# Patient Record
Sex: Female | Born: 1985 | Race: Black or African American | Hispanic: No | Marital: Single | Smoking: Current every day smoker
Health system: Southern US, Community
[De-identification: ages and names within clinical notes are randomized; demographics above are authoritative.]

## PROBLEM LIST (undated history)

## (undated) ENCOUNTER — Inpatient Hospital Stay (HOSPITAL_COMMUNITY): Payer: Self-pay

## (undated) DIAGNOSIS — O24419 Gestational diabetes mellitus in pregnancy, unspecified control: Secondary | ICD-10-CM

## (undated) DIAGNOSIS — Z789 Other specified health status: Secondary | ICD-10-CM

## (undated) DIAGNOSIS — O139 Gestational [pregnancy-induced] hypertension without significant proteinuria, unspecified trimester: Secondary | ICD-10-CM

## (undated) HISTORY — DX: Gestational (pregnancy-induced) hypertension without significant proteinuria, unspecified trimester: O13.9

## (undated) HISTORY — PX: COLPOSCOPY: SHX161

## (undated) HISTORY — DX: Gestational diabetes mellitus in pregnancy, unspecified control: O24.419

---
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
## 2004-02-10 ENCOUNTER — Emergency Department (HOSPITAL_COMMUNITY): Admission: EM | Admit: 2004-02-10 | Discharge: 2004-02-10 | Payer: Self-pay | Admitting: Emergency Medicine

## 2004-06-25 ENCOUNTER — Emergency Department (HOSPITAL_COMMUNITY): Admission: EM | Admit: 2004-06-25 | Discharge: 2004-06-25 | Payer: Self-pay | Admitting: Emergency Medicine

## 2005-08-20 ENCOUNTER — Emergency Department (HOSPITAL_COMMUNITY): Admission: EM | Admit: 2005-08-20 | Discharge: 2005-08-20 | Payer: Self-pay | Admitting: Emergency Medicine

## 2005-08-28 ENCOUNTER — Emergency Department (HOSPITAL_COMMUNITY): Admission: EM | Admit: 2005-08-28 | Discharge: 2005-08-28 | Payer: Self-pay | Admitting: Emergency Medicine

## 2006-02-11 ENCOUNTER — Observation Stay (HOSPITAL_COMMUNITY): Admission: RE | Admit: 2006-02-11 | Discharge: 2006-02-12 | Payer: Self-pay | Admitting: Obstetrics and Gynecology

## 2006-04-09 ENCOUNTER — Emergency Department (HOSPITAL_COMMUNITY): Admission: EM | Admit: 2006-04-09 | Discharge: 2006-04-09 | Payer: Self-pay | Admitting: Emergency Medicine

## 2006-04-28 ENCOUNTER — Emergency Department (HOSPITAL_COMMUNITY): Admission: EM | Admit: 2006-04-28 | Discharge: 2006-04-28 | Payer: Self-pay | Admitting: Emergency Medicine

## 2006-06-20 ENCOUNTER — Emergency Department (HOSPITAL_COMMUNITY): Admission: EM | Admit: 2006-06-20 | Discharge: 2006-06-20 | Payer: Self-pay | Admitting: Emergency Medicine

## 2006-08-17 ENCOUNTER — Emergency Department (HOSPITAL_COMMUNITY): Admission: EM | Admit: 2006-08-17 | Discharge: 2006-08-17 | Payer: Self-pay | Admitting: Emergency Medicine

## 2006-12-01 ENCOUNTER — Emergency Department (HOSPITAL_COMMUNITY): Admission: EM | Admit: 2006-12-01 | Discharge: 2006-12-01 | Payer: Self-pay | Admitting: Emergency Medicine

## 2006-12-02 ENCOUNTER — Emergency Department (HOSPITAL_COMMUNITY): Admission: EM | Admit: 2006-12-02 | Discharge: 2006-12-03 | Payer: Self-pay | Admitting: Emergency Medicine

## 2007-01-29 ENCOUNTER — Emergency Department (HOSPITAL_COMMUNITY): Admission: EM | Admit: 2007-01-29 | Discharge: 2007-01-29 | Payer: Self-pay | Admitting: Emergency Medicine

## 2007-03-27 ENCOUNTER — Emergency Department (HOSPITAL_COMMUNITY): Admission: EM | Admit: 2007-03-27 | Discharge: 2007-03-27 | Payer: Self-pay | Admitting: Emergency Medicine

## 2007-05-03 ENCOUNTER — Other Ambulatory Visit: Admission: RE | Admit: 2007-05-03 | Discharge: 2007-05-03 | Payer: Self-pay | Admitting: Obstetrics and Gynecology

## 2007-05-30 ENCOUNTER — Emergency Department (HOSPITAL_COMMUNITY): Admission: EM | Admit: 2007-05-30 | Discharge: 2007-05-30 | Payer: Self-pay | Admitting: Emergency Medicine

## 2007-10-04 ENCOUNTER — Inpatient Hospital Stay (HOSPITAL_COMMUNITY): Admission: AD | Admit: 2007-10-04 | Discharge: 2007-10-06 | Payer: Self-pay | Admitting: Gynecology

## 2007-10-04 ENCOUNTER — Ambulatory Visit: Payer: Self-pay | Admitting: *Deleted

## 2008-03-11 ENCOUNTER — Emergency Department (HOSPITAL_COMMUNITY): Admission: EM | Admit: 2008-03-11 | Discharge: 2008-03-11 | Payer: Self-pay | Admitting: Emergency Medicine

## 2008-06-21 ENCOUNTER — Emergency Department (HOSPITAL_COMMUNITY): Admission: EM | Admit: 2008-06-21 | Discharge: 2008-06-21 | Payer: Self-pay | Admitting: Emergency Medicine

## 2009-11-02 ENCOUNTER — Emergency Department (HOSPITAL_COMMUNITY): Admission: EM | Admit: 2009-11-02 | Discharge: 2009-11-02 | Payer: Self-pay | Admitting: Emergency Medicine

## 2011-01-02 NOTE — Group Therapy Note (Signed)
NAME:  Peggy [name], SIEVERT NO.:  [id number]   MEDICAL RECORD NO.:  [id number]          PATIENT TYPE:  OIB   LOCATION:  LDR1                          FACILITY:  [facility]   PHYSICIAN:  Lazaro Arms, M.D.   DATE OF BIRTH:  01/12/1986   DATE OF PROCEDURE:  DATE OF DISCHARGE:                                   PROGRESS NOTE   PROGRESS NOTE:  [name]'s cervix never changed overnight.  However, she is  still having a moderate amount of bright red bleeding.  She has about 3-4  inches by 1 inch of blood on her peri-pad every time she changes it every 3-  4 hours.  Fetal heart rate has remained reactive without decelerations.  Her  uterus appears to be less irritable than it was last night although she is  still having contractions about every 10-12 minutes.  We are sitting her  down for an OB ultrasound this morning.  I discussed plan of care with her  doctor, Dr. Almond Lint in [place].  If her ultrasound report comes back within normal  limits and she is stable for transfer, we will transfer her to [facility] for continued care.      Jacklyn Shell, C.N.M.      Lazaro Arms, M.D.  Electronically Signed    FC/MEDQ  D:  02/12/2006  T:  02/12/2006  Job:  [id number]

## 2011-01-02 NOTE — Discharge Summary (Signed)
[name], [name] NO.:  [id number]   MEDICAL RECORD NO.:  [id number]          PATIENT TYPE:  OIB   LOCATION:  LDR1                          FACILITY:  [facility]   PHYSICIAN:  Lazaro Arms, M.D.   DATE OF BIRTH:  1985-11-23   DATE OF ADMISSION:  02/11/2006  DATE OF DISCHARGE:  LH                                 DISCHARGE SUMMARY   Peggy [name]'s ultrasound came back normal with no evidence of abruption.  She is  still having a moderate amount of bright red to dark bleeding and occasional  uterine contractions.  Fetal heart rate is reactive with decelerations.  Her  cervical exam is unchanged.  I spoke with Dr. Ralph Dowdy and we are going to  transfer her via EMS to [facility] for continuation of care.      Jacklyn Shell, C.N.M.      Lazaro Arms, M.D.  Electronically Signed    FC/MEDQ  D:  02/12/2006  T:  02/12/2006  Job:  [id number]   cc:   Peggy [name]  Fax: [phone]

## 2011-01-02 NOTE — H&P (Signed)
NAME:  Peggy [name], Peggy [name]               ACCOUNT NO.:  [id number]   MEDICAL RECORD NO.:  [id number]          PATIENT TYPE:  OIB   LOCATION:  LDR1                          FACILITY:  [facility]   PHYSICIAN:  Tilda Burrow, M.D. DATE OF BIRTH:  [birth year]   DATE OF ADMISSION:  02/11/2006  DATE OF DISCHARGE:  LH                                HISTORY & PHYSICAL   ADDENDUM:  Fetal heart rate is in the 120 to 130 range, average long-term  variability, multiple accels and no decelerations.   __________      Jacklyn Shell, C.N.M.      Tilda Burrow, M.D.  Electronically Signed    FC/MEDQ  D:  02/12/2006  T:  02/12/2006  Job:  [id number]   cc:   [facility]

## 2011-01-02 NOTE — H&P (Signed)
[name], NILSSON NO.:  [id number]   MEDICAL RECORD NO.:  [id number]          PATIENT TYPE:  OIB   LOCATION:  A415                          FACILITY:  [facility]   PHYSICIAN:  Tilda Burrow, M.D. DATE OF BIRTH:  Oct 12, 1985   DATE OF ADMISSION:  02/11/2006  DATE OF DISCHARGE:  LH                                HISTORY & PHYSICAL   CHIEF COMPLAINT:  Vaginal bleeding after intercourse at [redacted] weeks gestation.   HISTORY OF PRESENT ILLNESS:  Peggy [name] is a patient of Dr. Ralph Dowdy and [facility] [place] [facility] [place]  She is pregnant with her first baby.  At this moment,  we are awaiting prenatal records from [facility].  Essentially Peggy [name]  said she had intercourse and directly after, she had some painless bright  red vaginal bleeding that ran down her legs.  Upon arriving to labor and  delivery, she said she started having some cramping.  She did state that she  has been having some intermittent back pain for the past couple of days.  Her cervix is 3 cm dilated, 50% effaced, at 0 to -1 station.  She is having  contractions about every 4-5 minutes.  Consultation with Dr. Emelda Fear  resulted in orders for just to try to stop her labor with narcotics due to  the bleeding.  She has received 10 mg of morphine IM and her contractions  are slowing down.  She is not having any active bleeding at the moment.   IMPRESSION:  Intrauterine pregnancy at 34 weeks, moderate amount of vaginal  bleeding, origin unknown at this time.   PLAN:  As stated before, we will try to keep her uterus quiet with IM  narcotics.  We will go ahead and provide GBS prophylaxis.  We will run  abruption labs and schedule an ultrasound for the morning if her bleeding  continues.  Otherwise, if she does in fact continue laboring, we will allow  it to ensue.     ______________________________  Chilton Si, CNM      Tilda Burrow, M.D.  Electronically Signed    Hadley Pen  D:  02/12/2006   T:  02/12/2006  Job:  [id number]   cc:   Dr. Ralph Dowdy and Alison Murray, [place]

## 2011-01-27 ENCOUNTER — Other Ambulatory Visit: Payer: Self-pay | Admitting: Nurse Practitioner

## 2011-01-27 ENCOUNTER — Other Ambulatory Visit (HOSPITAL_COMMUNITY)
Admission: RE | Admit: 2011-01-27 | Discharge: 2011-01-27 | Disposition: A | Discharge status: Home / Self Care | Payer: Self-pay | Source: Ambulatory Visit | Attending: Pediatrics | Admitting: Pediatrics

## 2011-01-27 ENCOUNTER — Other Ambulatory Visit (HOSPITAL_COMMUNITY)
Admission: RE | Admit: 2011-01-27 | Discharge: 2011-01-27 | Disposition: A | Discharge status: Home / Self Care | Payer: Self-pay | Source: Ambulatory Visit | Attending: Unknown Physician Specialty | Admitting: Unknown Physician Specialty

## 2011-01-27 DIAGNOSIS — B977 Papillomavirus as the cause of diseases classified elsewhere: Secondary | ICD-10-CM | POA: Insufficient documentation

## 2011-01-27 DIAGNOSIS — R87612 Low grade squamous intraepithelial lesion on cytologic smear of cervix (LGSIL): Secondary | ICD-10-CM | POA: Insufficient documentation

## 2011-01-27 DIAGNOSIS — N870 Mild cervical dysplasia: Secondary | ICD-10-CM | POA: Insufficient documentation

## 2011-05-08 LAB — CBC
HCT: 38.3
Hemoglobin: 13.1
Platelets: 130 — ABNORMAL LOW
RDW: 13.9

## 2011-05-08 LAB — RPR: RPR Ser Ql: NONREACTIVE

## 2011-05-15 LAB — CBC
HCT: 39
Platelets: 184
RBC: 4.76
WBC: 8.3

## 2011-05-15 LAB — DIFFERENTIAL
Basophils Relative: 1
Lymphs Abs: 3.1
Monocytes Absolute: 0.6
Monocytes Relative: 8
Neutrophils Relative %: 49

## 2011-05-15 LAB — BASIC METABOLIC PANEL
BUN: 17
Calcium: 9.4
GFR calc non Af Amer: >60

## 2011-05-15 LAB — POCT CARDIAC MARKERS: Troponin i, poc: <0.05

## 2011-06-01 LAB — CULTURE, ROUTINE-ABSCESS

## 2011-11-29 ENCOUNTER — Emergency Department (HOSPITAL_COMMUNITY)
Admission: EM | Admit: 2011-11-29 | Discharge: 2011-11-29 | Disposition: A | Discharge status: Home / Self Care | Payer: Self-pay | Attending: Emergency Medicine | Admitting: Emergency Medicine

## 2011-11-29 ENCOUNTER — Encounter (HOSPITAL_COMMUNITY): Payer: Self-pay

## 2011-11-29 DIAGNOSIS — F17200 Nicotine dependence, unspecified, uncomplicated: Secondary | ICD-10-CM | POA: Insufficient documentation

## 2011-11-29 DIAGNOSIS — I10 Essential (primary) hypertension: Secondary | ICD-10-CM | POA: Insufficient documentation

## 2011-11-29 DIAGNOSIS — T7840XA Allergy, unspecified, initial encounter: Secondary | ICD-10-CM | POA: Insufficient documentation

## 2011-11-29 DIAGNOSIS — R220 Localized swelling, mass and lump, head: Secondary | ICD-10-CM | POA: Insufficient documentation

## 2011-11-29 DIAGNOSIS — R221 Localized swelling, mass and lump, neck: Secondary | ICD-10-CM | POA: Insufficient documentation

## 2011-11-29 MED ORDER — FAMOTIDINE 20 MG PO TABS
40.0000 mg | ORAL_TABLET | Freq: Once | ORAL | Status: AC
  Administered 2011-11-29: 40 mg via ORAL
  Filled 2011-11-29: qty 2
  Filled 2011-11-29: qty 1

## 2011-11-29 MED ORDER — FAMOTIDINE 20 MG PO TABS: 20.0000 mg | ORAL_TABLET | Freq: Two times a day (BID) | ORAL | Status: DC

## 2011-11-29 MED ORDER — DIPHENHYDRAMINE HCL 25 MG PO CAPS
25.0000 mg | ORAL_CAPSULE | Freq: Once | ORAL | Status: AC
  Administered 2011-11-29: 25 mg via ORAL
  Filled 2011-11-29 (×2): qty 1

## 2011-11-29 MED ORDER — PREDNISONE 20 MG PO TABS
60.0000 mg | ORAL_TABLET | Freq: Once | ORAL | Status: AC
  Administered 2011-11-29: 60 mg via ORAL
  Filled 2011-11-29 (×2): qty 3

## 2011-11-29 MED ORDER — DIPHENHYDRAMINE HCL 25 MG PO TABS: 25.0000 mg | ORAL_TABLET | Freq: Four times a day (QID) | ORAL | Status: DC

## 2011-11-29 MED ORDER — PREDNISONE 50 MG PO TABS: ORAL_TABLET | ORAL | Status: AC

## 2011-11-29 NOTE — ED Notes (Signed)
Pt presents with lower lip swelling since last night. Pt denies contact with new food or products. Pt also reports itching eyes and eyes watering. Pt denies taking Benadryl. Pt states same reactions occurred last week. No airway compromise. Resp even and unlabored. NAD at this time.

## 2011-11-29 NOTE — Discharge Instructions (Signed)

## 2011-11-29 NOTE — ED Provider Notes (Signed)
History   This chart was scribed for Glynn Octave, MD scribed by Magnus Sinning. The patient was seen in room APA12/APA12 seen at 12:00.    CSN: [id number]  Arrival date & time 11/29/11  1115   First MD Initiated Contact with Patient 11/29/11 1146      Chief Complaint  Patient presents with  . Oral Swelling  . Allergic Reaction    (Consider location/radiation/quality/duration/timing/severity/associated sxs/prior treatment) HPI Peggy [name] is a 26 y.o. female who presents to the Emergency Department complaining of lower lip moderate swelling with associated watery eyes, and itchy throat, onset last night. Unsure of its cause, but suspects potential pollen allergy. Reports no changes in foods, medications, or hygiene products. States health is otherwise good. Denies trouble breathing, or difficulty swallowing.  History reviewed. No pertinent past medical history.  History reviewed. No pertinent past surgical history.  No family history on file.  History  Substance Use Topics  . Smoking status: Current Everyday Smoker -- 0.5 packs/day  . Smokeless tobacco: Not on file  . Alcohol Use: Yes     occasional    Review of Systems  All other systems reviewed and are negative.   10 Systems reviewed and are negative for acute change except as noted in the HPI. Allergies  Aspirin  Home Medications  No current outpatient prescriptions on file.  BP 142/88  Pulse 84  Temp(Src) 98.2 F (36.8 C) (Oral)  Resp 20  Ht 5\' 6"  (1.676 m)  Wt 160 lb (72.576 kg)  BMI 25.82 kg/m2  SpO2 97%  LMP 11/22/2011  Physical Exam  Nursing note and vitals reviewed. Constitutional: She is oriented to person, place, and time. She appears well-developed and well-nourished. No distress.  HENT:  Head: Normocephalic and atraumatic.       Moderate swelling of lower lip No oropharyngeal  edema Uvula midline Floor of mouth soft Tongue normal    Eyes: EOM are normal. Pupils are equal, round,  and reactive to light.  Neck: Neck supple. No tracheal deviation present.  Cardiovascular: Normal rate.   Pulmonary/Chest: Effort normal and breath sounds normal. No respiratory distress. She has no wheezes. She has no rales.  Abdominal: Soft. She exhibits no distension.  Musculoskeletal: Normal range of motion. She exhibits no edema.  Neurological: She is alert and oriented to person, place, and time. No sensory deficit.  Skin: Skin is warm and dry.  Psychiatric: She has a normal mood and affect. Her behavior is normal.    ED Course  Procedures (including critical care time) DIAGNOSTIC STUDIES: Oxygen Saturation is 97% on room air, normal by my interpretation.    COORDINATION OF CARE: 13:50: Physician performs recheck. Notes improved edema of lower lip. Discusses plan to d/c home with steroids and recommends benadryl at home  and if it worsens for her to return to ED.   No diagnosis found.    MDM  Lower lip swelling since last night.  No respiratory distress, wheezing, SOB, CP, rash, oral edema. No new foods, meds, beauty products. Lungs clear. No pharygeal or tongue swelling.  Ice, steroids, H2 and H1 blockers. Doubt true angioedema.  Observed in ED for 2 hours.  Decrease in lip swelling, tolerating secretions, lungs clear.  Patient wishing to go home.  Return precautions discussed. I personally performed the services described in this documentation, which was scribed in my presence.  The recorded information has been reviewed and considered.        Glynn Octave, MD 11/29/11 [phone]

## 2012-10-20 ENCOUNTER — Emergency Department (HOSPITAL_COMMUNITY)
Admission: EM | Admit: 2012-10-20 | Discharge: 2012-10-20 | Disposition: A | Discharge status: Home / Self Care | Payer: Self-pay | Attending: Emergency Medicine | Admitting: Emergency Medicine

## 2012-10-20 ENCOUNTER — Encounter (HOSPITAL_COMMUNITY): Payer: Self-pay | Admitting: *Deleted

## 2012-10-20 DIAGNOSIS — R300 Dysuria: Secondary | ICD-10-CM | POA: Insufficient documentation

## 2012-10-20 DIAGNOSIS — F17200 Nicotine dependence, unspecified, uncomplicated: Secondary | ICD-10-CM | POA: Insufficient documentation

## 2012-10-20 DIAGNOSIS — R110 Nausea: Secondary | ICD-10-CM | POA: Insufficient documentation

## 2012-10-20 DIAGNOSIS — R3915 Urgency of urination: Secondary | ICD-10-CM | POA: Insufficient documentation

## 2012-10-20 DIAGNOSIS — R350 Frequency of micturition: Secondary | ICD-10-CM | POA: Insufficient documentation

## 2012-10-20 DIAGNOSIS — N3090 Cystitis, unspecified without hematuria: Secondary | ICD-10-CM | POA: Insufficient documentation

## 2012-10-20 DIAGNOSIS — Z79899 Other long term (current) drug therapy: Secondary | ICD-10-CM | POA: Insufficient documentation

## 2012-10-20 LAB — URINALYSIS, ROUTINE W REFLEX MICROSCOPIC
Bilirubin Urine: NEGATIVE
Leukocytes, UA: NEGATIVE
Specific Gravity, Urine: 1.015 (ref 1.005–1.030)
pH: 7 (ref 5.0–8.0)

## 2012-10-20 LAB — URINE MICROSCOPIC-ADD ON

## 2012-10-20 MED ORDER — SULFAMETHOXAZOLE-TRIMETHOPRIM 800-160 MG PO TABS: 1.0000 | ORAL_TABLET | Freq: Two times a day (BID) | ORAL | Status: DC

## 2012-10-20 MED ORDER — PHENAZOPYRIDINE HCL 200 MG PO TABS: 200.0000 mg | ORAL_TABLET | Freq: Three times a day (TID) | ORAL | Status: DC

## 2012-10-20 NOTE — ED Notes (Signed)
uti sx x 2 wks with foul smelling urine, lower abd, and frequency.  Denies vaginal d/c.

## 2012-10-20 NOTE — ED Provider Notes (Signed)
History     CSN: [id number]  Arrival date & time 10/20/12  1031   First MD Initiated Contact with Patient 10/20/12 1038      Chief Complaint  Patient presents with  . Urinary Tract Infection    (Consider location/radiation/quality/duration/timing/severity/associated sxs/prior Treatment)  HPI Peggy [name] is a 27 y.o. female who presents to the ED with abdominal pain. This is a new problem. The pain started 2 weeks ago. The pain is located in the lower mid abdomen. She describes the pain as pressure. Associated symptoms include foul smelling urine, frequency and urgency. Denies vaginal discharge or bleeding. Implant for birth control. Now new sex partners, no concerns about STI's. The history was provided by the patient.  History reviewed. No pertinent past medical history.  History reviewed. No pertinent past surgical history.  No family history on file.  History  Substance Use Topics  . Smoking status: Current Every Day Smoker -- 0.50 packs/day  . Smokeless tobacco: Not on file  . Alcohol Use: Yes     Comment: occasional     OB History   Grav Para Term Preterm Abortions TAB SAB Ect Mult Living                  Review of Systems  Constitutional: Negative for fever, chills, diaphoresis and fatigue.  HENT: Negative for ear pain, congestion, sore throat, facial swelling, neck pain, neck stiffness, dental problem and sinus pressure.   Eyes: Negative for photophobia, pain and discharge.  Respiratory: Negative for cough, chest tightness and wheezing.   Cardiovascular: Negative for palpitations.  Gastrointestinal: Positive for nausea and abdominal pain. Negative for vomiting, diarrhea, constipation and abdominal distention.  Genitourinary: Positive for dysuria, urgency and frequency. Negative for flank pain and difficulty urinating.  Musculoskeletal: Negative for myalgias, back pain and gait problem.  Skin: Negative for color change and rash.  Neurological: Negative for  dizziness, speech difficulty, weakness, light-headedness, numbness and headaches.  Psychiatric/Behavioral: Negative for confusion and agitation. The patient is not nervous/anxious.     Allergies  Aspirin  Home Medications   Current Outpatient Rx  Name  Route  Sig  Dispense  Refill  . diphenhydrAMINE (BENADRYL) 25 MG tablet   Oral   Take 25 mg by mouth every 6 (six) hours.         . mometasone (NASONEX) 50 MCG/ACT nasal spray   Nasal   Place 2 sprays into the nose daily.           BP 130/85  Pulse 86  Temp(Src) 98.3 F (36.8 C) (Oral)  Resp 16  Ht 5\' 6"  (1.676 m)  Wt 180 lb (81.647 kg)  BMI 29.07 kg/m2  SpO2 99%  LMP 10/15/2012  Physical Exam  Nursing note and vitals reviewed. Constitutional: She is oriented to person, place, and time. She appears well-developed and well-nourished.  HENT:  Head: Normocephalic and atraumatic.  Eyes: EOM are normal. Pupils are equal, round, and reactive to light.  Neck: Normal range of motion. Neck supple.  Cardiovascular: Normal rate and regular rhythm.   Pulmonary/Chest: Effort normal and breath sounds normal.  Abdominal: Soft. Normal appearance and bowel sounds are normal. There is tenderness in the suprapubic area. There is no rebound, no guarding and no CVA tenderness.  Musculoskeletal: Normal range of motion. She exhibits no edema.  Neurological: She is alert and oriented to person, place, and time. No cranial nerve deficit.  Skin: Skin is warm and dry.  Psychiatric: She has a normal  mood and affect. Her behavior is normal. Judgment and thought content normal.   Results for orders placed during the hospital encounter of 10/20/12 (from the past 24 hour(s))  URINALYSIS, ROUTINE W REFLEX MICROSCOPIC     Status: Abnormal   Collection Time    10/20/12 10:47 AM      Result Value Range   Color, Urine YELLOW  YELLOW   APPearance CLEAR  CLEAR   Specific Gravity, Urine 1.015  1.005 - 1.030   pH 7.0  5.0 - 8.0   Glucose, UA  NEGATIVE  NEGATIVE mg/dL   Hgb urine dipstick MODERATE (*) NEGATIVE   Bilirubin Urine NEGATIVE  NEGATIVE   Ketones, ur NEGATIVE  NEGATIVE mg/dL   Protein, ur NEGATIVE  NEGATIVE mg/dL   Urobilinogen, UA 0.2  0.0 - 1.0 mg/dL   Nitrite NEGATIVE  NEGATIVE   Leukocytes, UA NEGATIVE  NEGATIVE  URINE MICROSCOPIC-ADD ON     Status: Abnormal   Collection Time    10/20/12 10:47 AM      Result Value Range   Squamous Epithelial / LPF FEW (*) RARE   RBC / HPF 3-6  <3 RBC/hpf   Bacteria, UA FEW (*) RARE    ED Course  Procedures  Assessment: 27 y.o. female with UTI symptoms   Cystitis  Plan:  Treat symptoms   Rx Septra DS   Rx Pyridium   Culture urine Discussed with the patient and all questioned fully answered. She will return if any problems arise.    Medication List    TAKE these medications       phenazopyridine 200 MG tablet  Commonly known as:  PYRIDIUM  Take 1 tablet (200 mg total) by mouth 3 (three) times daily.     sulfamethoxazole-trimethoprim 800-160 MG per tablet  Commonly known as:  SEPTRA DS  Take 1 tablet by mouth every 12 (twelve) hours.      ASK your doctor about these medications       diphenhydrAMINE 25 MG tablet  Commonly known as:  BENADRYL  Take 25 mg by mouth every 6 (six) hours.     mometasone 50 MCG/ACT nasal spray  Commonly known as:  NASONEX  Place 2 sprays into the nose daily.               Janne Napoleon, [place] 10/20/12 1141

## 2012-10-21 NOTE — ED Provider Notes (Signed)
Medical screening examination/treatment/procedure(s) were performed by non-physician practitioner and as supervising physician I was immediately available for consultation/collaboration.  Brian Cook, MD 10/21/12 1032 

## 2013-02-25 ENCOUNTER — Emergency Department (HOSPITAL_COMMUNITY)
Admission: EM | Admit: 2013-02-25 | Discharge: 2013-02-25 | Disposition: A | Discharge status: Home / Self Care | Payer: Self-pay | Attending: Emergency Medicine | Admitting: Emergency Medicine

## 2013-02-25 ENCOUNTER — Encounter (HOSPITAL_COMMUNITY): Payer: Self-pay

## 2013-02-25 DIAGNOSIS — F17200 Nicotine dependence, unspecified, uncomplicated: Secondary | ICD-10-CM | POA: Insufficient documentation

## 2013-02-25 DIAGNOSIS — B9689 Other specified bacterial agents as the cause of diseases classified elsewhere: Secondary | ICD-10-CM | POA: Insufficient documentation

## 2013-02-25 DIAGNOSIS — R109 Unspecified abdominal pain: Secondary | ICD-10-CM | POA: Insufficient documentation

## 2013-02-25 DIAGNOSIS — A499 Bacterial infection, unspecified: Secondary | ICD-10-CM | POA: Insufficient documentation

## 2013-02-25 DIAGNOSIS — Z3202 Encounter for pregnancy test, result negative: Secondary | ICD-10-CM | POA: Insufficient documentation

## 2013-02-25 DIAGNOSIS — R42 Dizziness and giddiness: Secondary | ICD-10-CM | POA: Insufficient documentation

## 2013-02-25 DIAGNOSIS — Z79899 Other long term (current) drug therapy: Secondary | ICD-10-CM | POA: Insufficient documentation

## 2013-02-25 DIAGNOSIS — N760 Acute vaginitis: Secondary | ICD-10-CM | POA: Insufficient documentation

## 2013-02-25 LAB — CBC WITH DIFFERENTIAL/PLATELET
Basophils Absolute: 0.1 10*3/uL (ref 0.0–0.1)
Eosinophils Relative: 3 % (ref 0–5)
HCT: 47.4 % — ABNORMAL HIGH (ref 36.0–46.0)
Lymphocytes Relative: 28 % (ref 12–46)
Lymphs Abs: 2.2 10*3/uL (ref 0.7–4.0)
MCV: 79.5 fL (ref 78.0–100.0)
Monocytes Absolute: 0.7 10*3/uL (ref 0.1–1.0)
RDW: 12.9 % (ref 11.5–15.5)
WBC: 8 10*3/uL (ref 4.0–10.5)

## 2013-02-25 LAB — URINE MICROSCOPIC-ADD ON

## 2013-02-25 LAB — COMPREHENSIVE METABOLIC PANEL
CO2: 26 mEq/L (ref 19–32)
Calcium: 9.3 mg/dL (ref 8.4–10.5)
Creatinine, Ser: 1.02 mg/dL (ref 0.50–1.10)
GFR calc Af Amer: 86 mL/min — ABNORMAL LOW (ref >90)
GFR calc non Af Amer: 74 mL/min — ABNORMAL LOW (ref >90)
Glucose, Bld: 116 mg/dL — ABNORMAL HIGH (ref 70–99)

## 2013-02-25 LAB — WET PREP, GENITAL
Trich, Wet Prep: NONE SEEN
Yeast Wet Prep HPF POC: NONE SEEN

## 2013-02-25 LAB — URINALYSIS, ROUTINE W REFLEX MICROSCOPIC
Bilirubin Urine: NEGATIVE
Glucose, UA: NEGATIVE mg/dL
Ketones, ur: NEGATIVE mg/dL
Nitrite: NEGATIVE
Specific Gravity, Urine: 1.02 (ref 1.005–1.030)
Urobilinogen, UA: 0.2 mg/dL (ref 0.0–1.0)
pH: 6 (ref 5.0–8.0)

## 2013-02-25 LAB — PREGNANCY, URINE: Preg Test, Ur: NEGATIVE

## 2013-02-25 MED ORDER — OXYCODONE-ACETAMINOPHEN 5-325 MG PO TABS: 2.0000 | ORAL_TABLET | ORAL | Status: DC | PRN

## 2013-02-25 MED ORDER — HYDROMORPHONE HCL PF 1 MG/ML IJ SOLN
1.0000 mg | Freq: Once | INTRAMUSCULAR | Status: AC
  Administered 2013-02-25: 1 mg via INTRAMUSCULAR
  Filled 2013-02-25: qty 1

## 2013-02-25 MED ORDER — METRONIDAZOLE 500 MG PO TABS: 500.0000 mg | ORAL_TABLET | Freq: Two times a day (BID) | ORAL | Status: DC

## 2013-02-25 NOTE — ED Notes (Signed)
Pt reports ab pain for 2 days, light headed at times., +nausea at times, denies any urinary s/s, no fever.  Slight diarrhea.

## 2013-02-25 NOTE — ED Provider Notes (Signed)
History  This chart was scribed for Donnetta Hutching, MD by Ardelia Mems, ED Scribe. This patient was seen in room APA15/APA15 and the patient's care was started at 3:14 PM.  CSN: [id number]  Arrival date & time 02/25/13  1456   Chief Complaint  Patient presents with  . Abdominal Pain    The history is provided by the patient. No language interpreter was used.   HPI Comments: Peggy [name] is a 27 y.o. female without significant PMH who presents to the Emergency Department complaining of gradual onset, gradually worsening, constant, moderate bilateral lower abdominal pain and left flank pain of 2-3 days duration. Pt states that her pain is worse while lying down. She reports "tossing and turning" in her sleep the past few nights due to the pain. Pt also reports feeling light-headed intermittently over the past few days. She rated her pain at a "7/10" in triage, although she does not appear to be in distress. Pt's LNMP was 02/20/13. She is sexually active and has a birth control implant. She does not express concern that she is pregnant, although she does express concern that she may have a bladder infection. Pt states that she has never had similar pain. She denies any hx of abdominal surgery and states that she has no chronic medical conditions. Pt denies taking OTC medications at home to improve symptoms. She denies vaginal bleeding, vaginal discharge, dysuria, constipation, diarrhea, fever or any other symptoms. Pt is an occasional alcohol user and a current every day smoker of 0.5 packs/day.  PCP- None  History reviewed. No pertinent past medical history.  History reviewed. No pertinent past surgical history.  No family history on file.  History  Substance Use Topics  . Smoking status: Current Every Day Smoker -- 0.50 packs/day    Types: Cigarettes  . Smokeless tobacco: Not on file  . Alcohol Use: Yes     Comment: occasional    OB History   Grav Para Term Preterm Abortions TAB SAB  Ect Mult Living                 Review of Systems  Constitutional: Negative for fever and chills.  HENT: Negative for congestion, sore throat, rhinorrhea and neck pain.   Eyes: Negative for visual disturbance.  Respiratory: Negative for cough and shortness of breath.   Cardiovascular: Negative for chest pain.  Gastrointestinal: Positive for abdominal pain. Negative for nausea, vomiting, diarrhea and constipation.  Genitourinary: Positive for flank pain. Negative for dysuria, hematuria, vaginal bleeding and vaginal discharge.  Musculoskeletal: Negative for back pain.  Skin: Negative for rash.  Neurological: Positive for light-headedness. Negative for headaches.  Psychiatric/Behavioral: Negative for confusion.  A complete 10 system review of systems was obtained and all systems are negative except as noted in the HPI and PMH.   Allergies  Aspirin  Home Medications   Current Outpatient Rx  Name  Route  Sig  Dispense  Refill  . diphenhydrAMINE (BENADRYL) 25 MG tablet   Oral   Take 25 mg by mouth every 6 (six) hours.         . mometasone (NASONEX) 50 MCG/ACT nasal spray   Nasal   Place 2 sprays into the nose daily.         . phenazopyridine (PYRIDIUM) 200 MG tablet   Oral   Take 1 tablet (200 mg total) by mouth 3 (three) times daily.   6 tablet   0   . sulfamethoxazole-trimethoprim (SEPTRA DS) 800-160 MG per  tablet   Oral   Take 1 tablet by mouth every 12 (twelve) hours.   10 tablet   0    Triage Vitals: BP 115/58  Pulse 88  Temp(Src) 98.4 F (36.9 C) (Oral)  Resp 18  Ht 5\' 6"  (1.676 m)  Wt 215 lb 7 oz (97.722 kg)  BMI 34.79 kg/m2  SpO2 99%  LMP 02/20/2013  Physical Exam  Nursing note and vitals reviewed. Constitutional: She is oriented to person, place, and time. She appears well-developed and well-nourished.  HENT:  Head: Normocephalic and atraumatic.  Eyes: Conjunctivae and EOM are normal. Pupils are equal, round, and reactive to light.  Neck: Normal  range of motion. Neck supple.  Cardiovascular: Normal rate, regular rhythm and normal heart sounds.   Pulmonary/Chest: Effort normal and breath sounds normal.  Abdominal: Soft. Bowel sounds are normal.  Slightly tenderness to palpation in lower abdomen bilaterally.  Musculoskeletal: Normal range of motion.  Neurological: She is alert and oriented to person, place, and time.  Skin: Skin is warm and dry.  Psychiatric: She has a normal mood and affect.    ED Course  Procedures (including critical care time)  DIAGNOSTIC STUDIES: Oxygen Saturation is 99% on RA, normal by my interpretation.    COORDINATION OF CARE: 3:15 PM- Pt advised of plan receive pregnancy test and urinalysis and pt agrees.  Medications  HYDROmorphone (DILAUDID) injection 1 mg (1 mg Intramuscular Given 02/25/13 1726)   Labs Reviewed  WET PREP, GENITAL - Abnormal; Notable for the following:    Clue Cells Wet Prep HPF POC FEW (*)    WBC, Wet Prep HPF POC MODERATE (*)    All other components within normal limits  URINALYSIS, ROUTINE W REFLEX MICROSCOPIC - Abnormal; Notable for the following:    Hgb urine dipstick TRACE (*)    Leukocytes, UA TRACE (*)    All other components within normal limits  URINE MICROSCOPIC-ADD ON - Abnormal; Notable for the following:    Bacteria, UA FEW (*)    All other components within normal limits  CBC WITH DIFFERENTIAL - Abnormal; Notable for the following:    RBC 5.96 (*)    Hemoglobin 17.2 (*)    HCT 47.4 (*)    MCHC 36.3 (*)    All other components within normal limits  COMPREHENSIVE METABOLIC PANEL - Abnormal; Notable for the following:    Glucose, Bld 116 (*)    AST 40 (*)    ALT 56 (*)    GFR calc non Af Amer 74 (*)    GFR calc Af Amer 86 (*)    All other components within normal limits  URINE CULTURE  GC/CHLAMYDIA PROBE AMP  PREGNANCY, URINE     No results found. No results found.  1. Abdominal pain   2. Bacterial vaginosis     MDM  No acute abdomen. White  count normal. Wet prep shows clue cells. Discharge meds Flagyl and Percocet.     I personally performed the services described in this documentation, which was scribed in my presence. The recorded information has been reviewed and is accurate.    Donnetta Hutching, MD 02/26/13 [phone]

## 2013-02-26 LAB — URINE CULTURE: Culture: NO GROWTH

## 2013-05-04 ENCOUNTER — Emergency Department (HOSPITAL_COMMUNITY)
Admission: EM | Admit: 2013-05-04 | Discharge: 2013-05-04 | Disposition: A | Discharge status: Home / Self Care | Payer: Self-pay | Attending: Emergency Medicine | Admitting: Emergency Medicine

## 2013-05-04 ENCOUNTER — Encounter (HOSPITAL_COMMUNITY): Payer: Self-pay | Admitting: *Deleted

## 2013-05-04 DIAGNOSIS — Z3202 Encounter for pregnancy test, result negative: Secondary | ICD-10-CM | POA: Insufficient documentation

## 2013-05-04 DIAGNOSIS — R1110 Vomiting, unspecified: Secondary | ICD-10-CM | POA: Insufficient documentation

## 2013-05-04 DIAGNOSIS — R197 Diarrhea, unspecified: Secondary | ICD-10-CM | POA: Insufficient documentation

## 2013-05-04 DIAGNOSIS — F17200 Nicotine dependence, unspecified, uncomplicated: Secondary | ICD-10-CM | POA: Insufficient documentation

## 2013-05-04 DIAGNOSIS — R1033 Periumbilical pain: Secondary | ICD-10-CM | POA: Insufficient documentation

## 2013-05-04 DIAGNOSIS — R000 Tachycardia, unspecified: Secondary | ICD-10-CM | POA: Insufficient documentation

## 2013-05-04 LAB — POCT PREGNANCY, URINE: Preg Test, Ur: NEGATIVE

## 2013-05-04 LAB — URINALYSIS, ROUTINE W REFLEX MICROSCOPIC
Nitrite: NEGATIVE
Specific Gravity, Urine: <1.005 — ABNORMAL LOW (ref 1.005–1.030)
Urobilinogen, UA: 0.2 mg/dL (ref 0.0–1.0)

## 2013-05-04 MED ORDER — SODIUM CHLORIDE 0.9 % IV BOLUS (SEPSIS)
1000.0000 mL | Freq: Once | INTRAVENOUS | Status: AC
  Administered 2013-05-04: 1000 mL via INTRAVENOUS

## 2013-05-04 MED ORDER — ONDANSETRON HCL 4 MG PO TABS: 4.0000 mg | ORAL_TABLET | Freq: Four times a day (QID) | ORAL | Status: DC

## 2013-05-04 NOTE — ED Provider Notes (Signed)
CSN: [id number]     Arrival date & time 05/04/13  2952 History  This chart was scribed for Peggy Churn, MD by Quintella Reichert, ED scribe.  This patient was seen in room APA09/APA09 and the patient's care was started at 7:37 AM.   Chief Complaint  Patient presents with  . Emesis  . Abdominal Pain    Patient is a 27 y.o. female presenting with vomiting. The history is provided by the patient. No language interpreter was used.  Emesis Severity:  Moderate Duration:  1 day Timing:  Intermittent Number of daily episodes:  3 Emesis appearance: yellowish. Progression:  Worsening Chronicity:  New Recent urination:  Normal Context: not post-tussive and not self-induced   Relieved by:  None tried Worsened by:  Nothing tried Ineffective treatments:  None tried Associated symptoms: abdominal pain and diarrhea   Associated symptoms: no chills, no cough, no fever and no sore throat   Abdominal pain:    Pain location: lower.   Quality:  Dull   Severity:  Moderate   Onset quality:  Gradual   Duration:  1 day   Timing:  Constant   Progression:  Worsening   Chronicity:  New Risk factors: no suspect food intake     HPI Comments: Peggy [name] is a 27 y.o. female with no pertinent medical history who presents to the Emergency Department complaining of one day of abdominal pain, nausea, vomiting and diarrhea.  Pt states she is unable to tolerate any food or liquids,  She vomited 3 times yesterday and one time this morning.  Vomit is described as yellowish without blood.  She has had diarrhea approximately the same number of times as she has vomited.  She denies blood in stool.  Abdominal pain is localized to the periumbilical region and is described as a constant moderate dull pain.  Pt has not taken any medications pta.  She denies fever, dysuria, frequency, vaginal discharge or bleeding, CP, SOB, congestion, sore throat, or rhinorrhea.  She denies suspect food intake.   History  reviewed. No pertinent past medical history.  History reviewed. No pertinent past surgical history.  No family history on file.  History  Substance Use Topics  . Smoking status: Current Every Day Smoker -- 0.50 packs/day    Types: Cigarettes  . Smokeless tobacco: Not on file  . Alcohol Use: Yes     Comment: occasional     OB History   Grav Para Term Preterm Abortions TAB SAB Ect Mult Living                  Review of Systems  Constitutional: Negative for chills.  HENT: Negative for sore throat.   Gastrointestinal: Positive for vomiting, abdominal pain and diarrhea.  All other systems reviewed and are negative.     Allergies  Aspirin  Home Medications   Current Outpatient Rx  Name  Route  Sig  Dispense  Refill  . metroNIDAZOLE (FLAGYL) 500 MG tablet   Oral   Take 1 tablet (500 mg total) by mouth 2 (two) times daily. One po bid x 7 days   14 tablet   0   . oxyCODONE-acetaminophen (PERCOCET) 5-325 MG per tablet   Oral   Take 2 tablets by mouth every 4 (four) hours as needed for pain.   15 tablet   0    BP 144/79  Pulse 108  Temp(Src) 98.8 F (37.1 C) (Oral)  Resp 16  Ht 5\' 6"  (1.676 m)  Wt 215 lb (97.523 kg)  BMI 34.72 kg/m2  SpO2 99%  LMP 04/15/2013  Physical Exam  Nursing note and vitals reviewed. Constitutional: She is oriented to person, place, and time. She appears well-developed and well-nourished. No distress.  HENT:  Head: Normocephalic and atraumatic.  Mouth/Throat: Oropharynx is clear and moist.  Eyes: Conjunctivae are normal. Pupils are equal, round, and reactive to light. No scleral icterus.  Neck: Neck supple.  Cardiovascular: Regular rhythm, normal heart sounds and intact distal pulses.  Tachycardia present.   No murmur heard. Pulmonary/Chest: Effort normal and breath sounds normal. No stridor. No respiratory distress. She has no rales.  Abdominal: Soft. Bowel sounds are normal. She exhibits no distension. There is no tenderness.  There is no rigidity, no rebound, no guarding and no CVA tenderness.  Musculoskeletal: Normal range of motion. She exhibits no edema.  Neurological: She is alert and oriented to person, place, and time.  Skin: Skin is warm and dry. No rash noted.  Psychiatric: She has a normal mood and affect. Her behavior is normal.    ED Course  Procedures (including critical care time)  DIAGNOSTIC STUDIES: Oxygen Saturation is 99% on room air, normal by my interpretation.    COORDINATION OF CARE: 7:44 AM-Discussed treatment plan which includes IV fluids, pain medication, anti-emetics, and labs with pt at bedside and pt agreed to plan.     Labs Review Labs Reviewed  URINALYSIS, ROUTINE W REFLEX MICROSCOPIC - Abnormal; Notable for the following:    Specific Gravity, Urine <1.005 (*)    All other components within normal limits  POCT PREGNANCY, URINE    Imaging Review No results found.  MDM   1. Vomiting and diarrhea    27 year old female with vomiting and diarrhea since yesterday. Nonbloody nonbilious. Well-appearing, slightly tachycardic, no apparent distress. Abdomen soft and nontender. She appears slightly dehydrated. Will treat with IV fluids, IV Zofran. Urinalysis and UPT pending. Based on her abdominal exam and appearance, don't think we labs or imaging at this point.  10:08 AM patient feeling better. Labs unremarkable. Will DC with return precautions  I personally performed the services described in this documentation, which was scribed in my presence. The recorded information has been reviewed and is accurate.     Peggy Churn, MD 05/04/13 1010

## 2013-05-04 NOTE — Care Management ED Note (Signed)
       CARE MANAGEMENT ED NOTE 05/04/2013  Patient:  Peggy [name], Peggy [name]   Account Number:  [id number]  Date Initiated:  05/04/2013  Documentation initiated by:  Anibal Henderson  Subjective/Objective Assessment:     Subjective/Objective Assessment Detail:   ED/CM noted patient did not have health insurance and/or PCP listed in the computer.  Patient was given the [facility] with information on the clinics, food pantries, and the handout for new health insurance sign-up.  Patient expressed appreciation for this.     Action/Plan:   Action/Plan Detail:   Anticipated DC Date:  05/04/2013     Status Recommendation to Physician:   Result of Recommendation:        Choice offered to / List presented to:            Status of service:  Completed, signed off  ED Comments:   ED Comments Detail:

## 2013-05-04 NOTE — Progress Notes (Signed)
ED/CM noted patient did not have health insurance and/or PCP listed in the computer.  Patient was given the [place] resource handout with information on the clinics, food pantries, and the handout for new health insurance sign-up.  Patient expressed appreciation for this. 

## 2013-05-04 NOTE — ED Notes (Signed)
Pt states she is unable to hold anything down. Vomiting and diarrhea began yesterday. Generalized abdominal pain.

## 2015-04-23 ENCOUNTER — Encounter (HOSPITAL_COMMUNITY): Payer: Self-pay | Admitting: *Deleted

## 2015-04-23 ENCOUNTER — Emergency Department (HOSPITAL_COMMUNITY)
Admission: EM | Admit: 2015-04-23 | Discharge: 2015-04-24 | Disposition: A | Discharge status: Home / Self Care | Payer: Self-pay | Attending: Emergency Medicine | Admitting: Emergency Medicine

## 2015-04-23 DIAGNOSIS — Z720 Tobacco use: Secondary | ICD-10-CM | POA: Insufficient documentation

## 2015-04-23 DIAGNOSIS — H109 Unspecified conjunctivitis: Secondary | ICD-10-CM | POA: Insufficient documentation

## 2015-04-23 MED ORDER — POLYMYXIN B-TRIMETHOPRIM 10000-0.1 UNIT/ML-% OP SOLN: 2.0000 [drp] | OPHTHALMIC | Status: DC

## 2015-04-23 MED ORDER — FLUORESCEIN SODIUM 1 MG OP STRP
1.0000 | ORAL_STRIP | Freq: Once | OPHTHALMIC | Status: AC
  Administered 2015-04-24: 1 via OPHTHALMIC
  Filled 2015-04-23: qty 1

## 2015-04-23 MED ORDER — TETRACAINE HCL 0.5 % OP SOLN
2.0000 [drp] | Freq: Once | OPHTHALMIC | Status: AC
  Administered 2015-04-24: 2 [drp] via OPHTHALMIC
  Filled 2015-04-23: qty 2

## 2015-04-23 NOTE — ED Provider Notes (Signed)
This chart was scribed for Peggy Maw Klohe Lovering, DO by Budd Palmer, ED Scribe. This patient was seen in room APA10/APA10 and the patient's care was started at 11:31 PM.   TIME SEEN: 11:31 PM   CHIEF COMPLAINT: Right Eye Redness and Drainage  HPI: Peggy [name] is a 29 y.o. female who presents to the Emergency Department complaining of redness and drainage to the right eye onset 2 days ago. She reports associated mildly cloudy vision. She notes she has been rubbing the eye a lot. Pt states she does not wear contact lenses or glasses. She states she did have a friend with similar symptoms one week ago. Pt denies eye pain, yellow discharge, or itchiness.  ROS: See HPI Constitutional: no fever  Eyes: drainage  ENT: no runny nose   Cardiovascular:  no chest pain  Resp: no SOB  GI: no vomiting GU: no dysuria Integumentary: no rash  Allergy: no hives  Musculoskeletal: no leg swelling  Neurological: no slurred speech ROS otherwise negative  PAST MEDICAL HISTORY/PAST SURGICAL HISTORY:  History reviewed. No pertinent past medical history.  MEDICATIONS:  Prior to Admission medications   Medication Sig Start Date End Date Taking? Authorizing Provider  etonogestrel (IMPLANON) 68 MG IMPL implant Inject 68 mg into the skin once.   Yes Historical Provider, MD  Tetrahydrozoline HCl (EYE DROPS OP) Apply 1-2 drops to eye once as needed (for eye irritation).   Yes Historical Provider, MD    ALLERGIES:  Allergies  Allergen Reactions  . Aspirin Swelling    ORAL SWELLING    SOCIAL HISTORY:  Social History  Substance Use Topics  . Smoking status: Current Every Day Smoker -- 0.50 packs/day    Types: Cigarettes  . Smokeless tobacco: Not on file  . Alcohol Use: Yes     Comment: occasional     FAMILY HISTORY: History reviewed. No pertinent family history.  EXAM: BP 167/93 mmHg  Pulse 109  Temp(Src) 98.2 F (36.8 C) (Oral)  Resp 18  Ht  (1.676 m)  Wt 210 lb 8 oz (95.482 kg)  BMI  33.99 kg/m2  SpO2 99%  LMP 04/16/2015 CONSTITUTIONAL: Alert and oriented and responds appropriately to questions. Well-appearing; well-nourished HEAD: Normocephalic EYES: Conjunctivae clear on the L, injected on the R with no purulent drainage, EOMI, PERRL, pressure in the R eye is 25 mmHg. No corneal abrasions, no ulcers. No pain with consensual light response. Normal light reflex. No hyphema or hypopyon. ENT: normal nose; no rhinorrhea; moist mucous membranes; pharynx without lesions noted NECK: Supple, no meningismus, no LAD  CARD: RRR; S1 and S2 appreciated; no murmurs, no clicks, no rubs, no gallops RESP: Normal chest excursion without splinting or tachypnea; breath sounds clear and equal bilaterally; no wheezes, no rhonchi, no rales, no hypoxia or respiratory distress, speaking full sentences ABD/GI: Normal bowel sounds; non-distended; soft, non-tender, no rebound, no guarding, no peritoneal signs BACK:  The back appears normal and is non-tender to palpation, there is no CVA tenderness EXT: Normal ROM in all joints; non-tender to palpation; no edema; normal capillary refill; no cyanosis, no calf tenderness or swelling    SKIN: Normal color for age and race; warm NEURO: Moves all extremities equally, sensation to light touch intact diffusely, cranial nerves II through XII intact PSYCH: The patient's mood and manner are appropriate. Grooming and personal hygiene are appropriate.  MEDICAL DECISION MAKING: Patient here with right conjunctivitis. No corneal abrasion or ulcer seen on exam. Visual acuity as documented and unremarkable.  Patient reports cloudy vision improves with tetracaine as well as her irritation. We'll discharge with Polytrim drops ophthalmology follow-up information. Discussed return precautions. She verbalized understanding and is content with this plan.  I personally performed the services described in this documentation, which was scribed in my presence. The recorded  information has been reviewed and is accurate.   Peggy Maw Milo Schreier, DO 04/23/15 2354

## 2015-04-23 NOTE — Discharge Instructions (Signed)
Conjunctivitis °Conjunctivitis is commonly called "pink eye." Conjunctivitis can be caused by bacterial or viral infection, allergies, or injuries. There is usually redness of the lining of the eye, itching, discomfort, and sometimes discharge. There may be deposits of matter along the eyelids. A viral infection usually causes a watery discharge, while a bacterial infection causes a yellowish, thick discharge. Pink eye is very contagious and spreads by direct contact. °You may be given antibiotic eyedrops as part of your treatment. Before using your eye medicine, remove all drainage from the eye by washing gently with warm water and cotton balls. Continue to use the medication until you have awakened 2 mornings in a row without discharge from the eye. Do not rub your eye. This increases the irritation and helps spread infection. Use separate towels from other household members. Wash your hands with soap and water before and after touching your eyes. Use cold compresses to reduce pain and sunglasses to relieve irritation from light. Do not wear contact lenses or wear eye makeup until the infection is gone. °SEEK MEDICAL CARE IF:  °· Your symptoms are not better after 3 days of treatment. °· You have increased pain or trouble seeing. °· The outer eyelids become very red or swollen. °Document Released: 09/10/2004 Document Revised: 10/26/2011 Document Reviewed: 08/03/2005 °ExitCare® Patient Information ©2015 [employer]. This information is not intended to replace advice given to you by your health care provider. Make sure you discuss any questions you have with your health care provider. ° °Eye Drops °Use eye drops as directed. It may be easier to have someone help you put the drops in your eye. If you are alone, use the following instructions to help you. °· Wash your hands before putting drops in your eyes. °· Read the label and look at your medication. Check for any expiration date that may appear on the bottle or  tube. Changes of color may be a warning that the medication is old or ineffective. This is especially true if the medication has become brown in color. If you have questions or concerns, call your caregiver. °DROPS °· Tilt your head back with the affected eye uppermost. Gently pull down on your lower lid. Do not pull up on the upper lid. °· Look up. Place the dropper or bottle just over the edge of the lower lid near the white portion at the bottom of the eye. The goal is to have the drop go into the little sac formed by the lower lid and the bottom of the eye itself. Do not release the drop from a height of several inches over the eye. That will only serve to startle the person receiving the medicine when it lands and forces a blink. °· Steady your hand in a comfortable manner. An example would be to hold the dropper or bottle between your thumb and index (pointing) finger. Lean your index finger against the brow. °· Then, slowly and gently squeeze one drop of medication into your eye. °· Once the medication has been applied, place your finger between the lower eyelid and the nose, pressing firmly against the nose for 5-10 seconds. This will slow the process of the eye drop entering the small canal that normally drains tears into the nose, and therefore increases the exposure of the medicine to the eye for a few extra seconds. °OINTMENTS °· Look up. Place the tip of the tube just over the edge of the lower lid near the white portion at the bottom of the   eye. The goal is to create a line of ointment along the inner surface of the eyelid in the little sac formed by the lower lid and the bottom of the eye itself. °· Avoid touching the tube tip to your eyeball or eyelid. This avoids contamination of the tube or the medicine in the tube. °· Once a line of medicine has been created, hold the upper lid up and look down before releasing the upper lid. This will force the ointment to spread over the surface of the  eye. °· Your vision will be very blurry for a few minutes after applying an ointment properly. This is normal and will clear as you continue to blink. For this reason, it is best to apply ointments just before going to sleep, or at a time when you can rest your eyes for 5-10 minutes after applying the medication. °GENERAL °· Store your medicine in a cool, dry place after each use. °· If you need a second medication, wait at least two minutes. This helps the first medication to be taken up (absorbed) by the eye. °· If you have been instructed to use both an eye drop and an eye ointment, always apply the drop first and then the ointment 3-4 minutes afterward. °Never put medications into the eye unless the label reads, "For Ophthalmic Use," "For Use In Eyes" or "Eye Drops." If you have questions, call your caregiver. °Document Released: 11/09/2000 Document Revised: 12/18/2013 Document Reviewed: 01/15/2009 °ExitCare® Patient Information ©2015 [employer]. This information is not intended to replace advice given to you by your health care provider. Make sure you discuss any questions you have with your health care provider. ° °

## 2015-04-23 NOTE — ED Notes (Signed)
Pt c/o redness and drainage to right eye x 2 days

## 2015-04-24 NOTE — ED Notes (Signed)
Discharge instructions given, pt demonstrated teach back and verbal understanding. No concerns voiced.  

## 2015-07-10 ENCOUNTER — Emergency Department (HOSPITAL_COMMUNITY)
Admission: EM | Admit: 2015-07-10 | Discharge: 2015-07-10 | Disposition: A | Discharge status: Home / Self Care | Payer: Self-pay | Attending: Emergency Medicine | Admitting: Emergency Medicine

## 2015-07-10 ENCOUNTER — Encounter (HOSPITAL_COMMUNITY): Payer: Self-pay | Admitting: Emergency Medicine

## 2015-07-10 DIAGNOSIS — F17210 Nicotine dependence, cigarettes, uncomplicated: Secondary | ICD-10-CM | POA: Insufficient documentation

## 2015-07-10 DIAGNOSIS — L03818 Cellulitis of other sites: Secondary | ICD-10-CM

## 2015-07-10 DIAGNOSIS — L03113 Cellulitis of right upper limb: Secondary | ICD-10-CM | POA: Insufficient documentation

## 2015-07-10 MED ORDER — SULFAMETHOXAZOLE-TRIMETHOPRIM 800-160 MG PO TABS: 1.0000 | ORAL_TABLET | Freq: Two times a day (BID) | ORAL | Status: AC

## 2015-07-10 MED ORDER — TRAMADOL HCL 50 MG PO TABS: 50.0000 mg | ORAL_TABLET | Freq: Four times a day (QID) | ORAL | Status: DC | PRN

## 2015-07-10 MED ORDER — SULFAMETHOXAZOLE-TRIMETHOPRIM 800-160 MG PO TABS
1.0000 | ORAL_TABLET | Freq: Once | ORAL | Status: AC
  Administered 2015-07-10: 1 via ORAL
  Filled 2015-07-10: qty 1

## 2015-07-10 NOTE — ED Notes (Signed)
Pt reports tenderness to right posterior shoulder. Pt denies any known insect bite. Pt reports pain to site. Skin taught.

## 2015-07-10 NOTE — ED Notes (Signed)
Pt made aware to return if symptoms worsen or if any life threatening symptoms occur.   

## 2015-07-10 NOTE — Discharge Instructions (Signed)
Cellulitis °Cellulitis is an infection of the skin and the tissue under the skin. The infected area is usually red and tender. This happens most often in the arms and lower legs. °HOME CARE  °· Take your antibiotic medicine as told. Finish the medicine even if you start to feel better. °· Keep the infected arm or leg raised (elevated). °· Put a warm cloth on the area up to 4 times per day. °· Only take medicines as told by your doctor. °· Keep all doctor visits as told. °GET HELP IF: °· You see red streaks on the skin coming from the infected area. °· Your red area gets bigger or turns a dark color. °· Your bone or joint under the infected area is painful after the skin heals. °· Your infection comes back in the same area or different area. °· You have a puffy (swollen) bump in the infected area. °· You have new symptoms. °· You have a fever. °GET HELP RIGHT AWAY IF:  °· You feel very sleepy. °· You throw up (vomit) or have watery poop (diarrhea). °· You feel sick and have muscle aches and pains. °  °This information is not intended to replace advice given to you by your health care provider. Make sure you discuss any questions you have with your health care provider. °  °Document Released: 01/20/2008 Document Revised: 04/24/2015 Document Reviewed: 10/19/2011 °Elsevier Interactive Patient Education ©2016 [employer] ° °

## 2015-07-12 NOTE — ED Provider Notes (Signed)
CSN: [id number]     Arrival date & time 07/10/15  1048 History   First MD Initiated Contact with Patient 07/10/15 1101     Chief Complaint  Patient presents with  . Rash     (Consider location/radiation/quality/duration/timing/severity/associated sxs/prior Treatment) HPI  Thereasa SoloLatise S [name] is a 29 y.o. female who presents to the Emergency Department complaining of pain to right shoulder for 1-2 days.  She notes redness of the same area and states the "skin feels tight" she has not tried any therapies.  She denies fever, chills, insect bite, or new lotions or soaps.   History reviewed. No pertinent past medical history. History reviewed. No pertinent past surgical history. History reviewed. No pertinent family history. Social History  Substance Use Topics  . Smoking status: Current Every Day Smoker -- 0.50 packs/day    Types: Cigarettes  . Smokeless tobacco: None  . Alcohol Use: Yes     Comment: occasional    OB History    No data available     Review of Systems  Constitutional: Negative for fever and chills.  Gastrointestinal: Negative for nausea and vomiting.  Genitourinary: Negative for dysuria.  Musculoskeletal: Negative for joint swelling and arthralgias.  Skin: Positive for color change. Negative for wound.  Neurological: Negative for weakness and numbness.  Hematological: Negative for adenopathy.  All other systems reviewed and are negative.     Allergies  Aspirin  Home Medications   Prior to Admission medications   Medication Sig Start Date End Date Taking? Authorizing Provider  etonogestrel (IMPLANON) 68 MG IMPL implant Inject 68 mg into the skin once.    Historical Provider, MD  sulfamethoxazole-trimethoprim (BACTRIM DS,SEPTRA DS) 800-160 MG tablet Take 1 tablet by mouth 2 (two) times daily. For 10 days 07/10/15 07/17/15  Justyn Langham, PA-C  Tetrahydrozoline HCl (EYE DROPS OP) Apply 1-2 drops to eye once as needed (for eye irritation).    Historical  Provider, MD  traMADol (ULTRAM) 50 MG tablet Take 1 tablet (50 mg total) by mouth every 6 (six) hours as needed. 07/10/15   Delfina Schreurs, PA-C  trimethoprim-polymyxin b (POLYTRIM) ophthalmic solution Place 2 drops into the right eye every 4 (four) hours. For one week 04/23/15   Kristen N Ward, DO   BP 129/103 mmHg  Pulse 104  Temp(Src) 98.1 F (36.7 C) (Oral)  Resp 18  Ht 5\' 6"  (1.676 m)  Wt 90.719 kg  BMI 32.30 kg/m2  SpO2 99%  LMP 07/01/2015 Physical Exam  Constitutional: She is oriented to person, place, and time. She appears well-developed and well-nourished. No distress.  HENT:  Head: Normocephalic and atraumatic.  Neck: Normal range of motion. Neck supple.  Cardiovascular: Normal rate, regular rhythm and normal heart sounds.   No murmur heard. Pulmonary/Chest: Effort normal and breath sounds normal. No respiratory distress.  Musculoskeletal: Normal range of motion.  Lymphadenopathy:    She has no cervical adenopathy.  Neurological: She is alert and oriented to person, place, and time. She exhibits normal muscle tone. Coordination normal.  Skin: Skin is warm and dry. There is erythema.  Localized induration and erythema of the right posterior shoulder.  No drainage, or abscess    Nursing note and vitals reviewed.   ED Course  Procedures (including critical care time)     MDM   Final diagnoses:  Cellulitis of other specified site    Localized induration of the right shoulder. Appears c/w cellulitis.  Pt agrees to use OTC hydrocortisone cream and Rx for ultram  and bactrim.  Advised to return for worsening sx's.    Pauline Aus, PA-C 07/12/15 2040  Azalia Bilis, MD 07/13/15 2308

## 2017-04-16 ENCOUNTER — Emergency Department (HOSPITAL_COMMUNITY)
Admission: EM | Admit: 2017-04-16 | Discharge: 2017-04-16 | Disposition: A | Discharge status: Home / Self Care | Payer: Self-pay | Attending: Emergency Medicine | Admitting: Emergency Medicine

## 2017-04-16 ENCOUNTER — Encounter (HOSPITAL_COMMUNITY): Payer: Self-pay | Admitting: Emergency Medicine

## 2017-04-16 DIAGNOSIS — H00012 Hordeolum externum right lower eyelid: Secondary | ICD-10-CM | POA: Insufficient documentation

## 2017-04-16 DIAGNOSIS — F17210 Nicotine dependence, cigarettes, uncomplicated: Secondary | ICD-10-CM | POA: Insufficient documentation

## 2017-04-16 MED ORDER — KETOROLAC TROMETHAMINE 0.5 % OP SOLN
1.0000 [drp] | Freq: Once | OPHTHALMIC | Status: AC
  Administered 2017-04-16: 1 [drp] via OPHTHALMIC
  Filled 2017-04-16: qty 5

## 2017-04-16 MED ORDER — TOBRAMYCIN 0.3 % OP SOLN
2.0000 [drp] | Freq: Once | OPHTHALMIC | Status: AC
  Administered 2017-04-16: 2 [drp] via OPHTHALMIC
  Filled 2017-04-16: qty 5

## 2017-04-16 NOTE — ED Triage Notes (Signed)
Pt reports pain and swelling to right lower lid for 2 days. Pt reports eye is watery. No injury

## 2017-04-16 NOTE — ED Provider Notes (Signed)
[facility]-EMERGENCY DEPT Provider Note   CSN: [id number] Arrival date & time: 04/16/17  1734     History   Chief Complaint Chief Complaint  Patient presents with  . Eye Pain    HPI Peggy [name] is a 31 y.o. female.  Patient is a 31 year old female who presents to the emergency department with a complaint of pain and swelling of the right eye.  The patient states that this problem is been going on for 2 or 3 days. She first noticed a little mild swelling of the right lower lid, this continued to increase over the next couple of days. She now has pain, and feels as though something is in her eye when she blinks. She states it's causing pain and at times causing headaches. She's not had any injury to the eye she's not been hit or been in any accidents. She does not recall anything flying and/or blowing in her eye. She's not had any operations or procedures involving the right thigh.      History reviewed. No pertinent past medical history.  There are no active problems to display for this patient.   History reviewed. No pertinent surgical history.  OB History    No data available       Home Medications    Prior to Admission medications   Medication Sig Start Date End Date Taking? Authorizing Provider  etonogestrel (IMPLANON) 68 MG IMPL implant Inject 68 mg into the skin once.    [provider]  Tetrahydrozoline HCl (EYE DROPS OP) Apply 1-2 drops to eye once as needed (for eye irritation).    [provider]  traMADol (ULTRAM) 50 MG tablet Take 1 tablet (50 mg total) by mouth every 6 (six) hours as needed. 07/10/15   Triplett, Tammy, PA-C  trimethoprim-polymyxin b (POLYTRIM) ophthalmic solution Place 2 drops into the right eye every 4 (four) hours. For one week 04/23/15   Ward, Layla MawKristen N, DO    Family History No family history on file.  Social History Social History  Substance Use Topics  . Smoking status: Current Every Day Smoker    Packs/day: 0.50      Types: Cigarettes  . Smokeless tobacco: Not on file  . Alcohol use Yes     Comment: occasional      Allergies   Aspirin   Review of Systems Review of Systems  Constitutional: Negative for activity change.       All ROS Neg except as noted in HPI  HENT: Negative for nosebleeds.   Eyes: Positive for pain and redness. Negative for photophobia and discharge.  Respiratory: Negative for cough, shortness of breath and wheezing.   Cardiovascular: Negative for chest pain and palpitations.  Gastrointestinal: Negative for abdominal pain and blood in stool.  Genitourinary: Negative for dysuria, frequency and hematuria.  Musculoskeletal: Negative for arthralgias, back pain and neck pain.  Skin: Negative.   Neurological: Negative for dizziness, seizures and speech difficulty.  Psychiatric/Behavioral: Negative for confusion and hallucinations.     Physical Exam Updated Vital Signs BP (!) 151/107   Pulse 95   Temp 97.8 F (36.6 C)   Resp 18   Ht 5' 5.5" (1.664 m)   Wt 95.3 kg (210 lb)   LMP 04/10/2017   SpO2 98%   BMI 34.41 kg/m   Physical Exam  Constitutional: She is oriented to person, place, and time. She appears well-developed and well-nourished.  Non-toxic appearance.  HENT:  Head: Normocephalic.  Right Ear: Tympanic membrane and  external ear normal.  Left Ear: Tympanic membrane and external ear normal.  Eyes: Pupils are equal, round, and reactive to light. EOM and lids are normal.  There is a stye present of the right lower lid. There is increased redness of the conjunctiva on the right. Left eye is within normal limits. There is no bulging of the right anterior chamber. There are no foreign bodies under the upper lid.  Neck: Normal range of motion. Neck supple. Carotid bruit is not present.  Cardiovascular: Normal rate, regular rhythm, normal heart sounds, intact distal pulses and normal pulses.   Pulmonary/Chest: Breath sounds normal. No respiratory distress.   Abdominal: Soft. Bowel sounds are normal. There is no tenderness. There is no guarding.  Musculoskeletal: Normal range of motion.  Lymphadenopathy:       Head (right side): No submandibular adenopathy present.       Head (left side): No submandibular adenopathy present.    She has no cervical adenopathy.  Neurological: She is alert and oriented to person, place, and time. She has normal strength. No cranial nerve deficit or sensory deficit.  Skin: Skin is warm and dry.  Psychiatric: She has a normal mood and affect. Her speech is normal.  Nursing note and vitals reviewed.    ED Treatments / Results  Labs (all labs ordered are listed, but only abnormal results are displayed) Labs Reviewed - No data to display  EKG  EKG Interpretation None       Radiology No results found.  Procedures Procedures (including critical care time)  Medications Ordered in ED Medications  ketorolac (ACULAR) 0.5 % ophthalmic solution 1 drop (1 drop Right Eye Given 04/16/17 1912)  tobramycin (TOBREX) 0.3 % ophthalmic solution 2 drop (2 drops Right Eye Given 04/16/17 1912)     Initial Impression / Assessment and Plan / ED Course  I have reviewed the triage vital signs and the nursing notes.  Pertinent labs & imaging results that were available during my care of the patient were reviewed by me and considered in my medical decision making (see chart for details).       Final Clinical Impressions(s) / ED Diagnoses MDm Patient's blood pressure is elevated at 151/107. I've asked the patient to see her primary physician, or physicians at the health department to have this rechecked.  The examination suggest a stye of the right lower lid. We discussed the use of warm compresses. We also discussed the use of Acular there 2 times daily, and tobramycin every 4 hours over the next 4 or 5 days. The patient will follow-up with Dr. Lita Mains for ophthalmology evaluation if this is not effective.    Final  diagnoses:  Hordeolum externum of right lower eyelid    New Prescriptions New Prescriptions   No medications on file     Ivery Quale, Cordelia Poche 04/16/17 1918    Samuel Jester, DO 04/16/17 2102

## 2017-04-16 NOTE — Discharge Instructions (Signed)
Please apply warm compresses to your right eye 3 or 4 times daily. Please use 2 drops of tobramycin to your right eye every 4 hours over the next 4 or 5 days. Use 1 drop of ketorolac to your eye 2 times daily. Please see Dr.Haines, or a member of his team for additional ophthalmology evaluation if not improving.

## 2017-07-29 ENCOUNTER — Encounter (HOSPITAL_COMMUNITY): Payer: Self-pay | Admitting: *Deleted

## 2017-07-29 ENCOUNTER — Inpatient Hospital Stay (HOSPITAL_COMMUNITY)
Admission: AD | Admit: 2017-07-29 | Discharge: 2017-07-29 | Disposition: A | Discharge status: Home / Self Care | Payer: Medicaid Other | Source: Ambulatory Visit | Attending: Family Medicine | Admitting: Family Medicine

## 2017-07-29 ENCOUNTER — Inpatient Hospital Stay (HOSPITAL_COMMUNITY): Payer: Medicaid Other

## 2017-07-29 DIAGNOSIS — O209 Hemorrhage in early pregnancy, unspecified: Secondary | ICD-10-CM | POA: Diagnosis not present

## 2017-07-29 DIAGNOSIS — O99331 Smoking (tobacco) complicating pregnancy, first trimester: Secondary | ICD-10-CM | POA: Insufficient documentation

## 2017-07-29 DIAGNOSIS — W000XXA Fall on same level due to ice and snow, initial encounter: Secondary | ICD-10-CM | POA: Diagnosis not present

## 2017-07-29 DIAGNOSIS — O23591 Infection of other part of genital tract in pregnancy, first trimester: Secondary | ICD-10-CM

## 2017-07-29 DIAGNOSIS — F17210 Nicotine dependence, cigarettes, uncomplicated: Secondary | ICD-10-CM | POA: Diagnosis not present

## 2017-07-29 DIAGNOSIS — A5901 Trichomonal vulvovaginitis: Secondary | ICD-10-CM | POA: Insufficient documentation

## 2017-07-29 DIAGNOSIS — O98311 Other infections with a predominantly sexual mode of transmission complicating pregnancy, first trimester: Secondary | ICD-10-CM | POA: Diagnosis not present

## 2017-07-29 DIAGNOSIS — Z3491 Encounter for supervision of normal pregnancy, unspecified, first trimester: Secondary | ICD-10-CM

## 2017-07-29 DIAGNOSIS — Z3A01 Less than 8 weeks gestation of pregnancy: Secondary | ICD-10-CM | POA: Diagnosis not present

## 2017-07-29 DIAGNOSIS — R109 Unspecified abdominal pain: Secondary | ICD-10-CM | POA: Diagnosis present

## 2017-07-29 LAB — POCT PREGNANCY, URINE: PREG TEST UR: POSITIVE — AB

## 2017-07-29 LAB — URINALYSIS, ROUTINE W REFLEX MICROSCOPIC
BACTERIA UA: NONE SEEN
BILIRUBIN URINE: NEGATIVE
GLUCOSE, UA: NEGATIVE mg/dL
HGB URINE DIPSTICK: NEGATIVE
Ketones, ur: 20 mg/dL — AB
NITRITE: NEGATIVE
Protein, ur: NEGATIVE mg/dL
Specific Gravity, Urine: 1.024 (ref 1.005–1.030)
pH: 5 (ref 5.0–8.0)

## 2017-07-29 LAB — WET PREP, GENITAL
Sperm: NONE SEEN
Yeast Wet Prep HPF POC: NONE SEEN

## 2017-07-29 LAB — CBC
HCT: 41.1 % (ref 36.0–46.0)
Hemoglobin: 14 g/dL (ref 12.0–15.0)
MCH: 28.2 pg (ref 26.0–34.0)
MCHC: 34.1 g/dL (ref 30.0–36.0)
MCV: 82.9 fL (ref 78.0–100.0)
Platelets: 213 10*3/uL (ref 150–400)
RBC: 4.96 MIL/uL (ref 3.87–5.11)
RDW: 13.5 % (ref 11.5–15.5)
WBC: 10.7 10*3/uL — ABNORMAL HIGH (ref 4.0–10.5)

## 2017-07-29 LAB — HCG, QUANTITATIVE, PREGNANCY: hCG, Beta Chain, Quant, S: 32532 m[IU]/mL — ABNORMAL HIGH (ref <5)

## 2017-07-29 LAB — ABO/RH: ABO/RH(D): O POS

## 2017-07-29 MED ORDER — METRONIDAZOLE 500 MG PO TABS
2000.0000 mg | ORAL_TABLET | Freq: Once | ORAL | Status: AC
  Administered 2017-07-29: 2000 mg via ORAL
  Filled 2017-07-29: qty 4

## 2017-07-29 MED ORDER — PREPLUS 27-1 MG PO TABS: 1.0000 | ORAL_TABLET | Freq: Every day | ORAL | 13 refills | Status: DC

## 2017-07-29 MED ORDER — METRONIDAZOLE 500 MG PO TABS: 500.0000 mg | ORAL_TABLET | Freq: Two times a day (BID) | ORAL | 0 refills | Status: DC

## 2017-07-29 NOTE — Discharge Instructions (Signed)
Expedited Partner Therapy:  °Information Sheet for Patients and Partners  °            ° °You have been offered expedited partner therapy (EPT). This information sheet contains important information and warnings you need to be aware of, so please read it carefully.  ° °Expedited Partner Therapy (EPT) is the clinical practice of treating the sexual partners of persons who receive chlamydia, gonorrhea, or trichomoniasis diagnoses by providing medications or prescriptions to the patient. Patients then provide partners with these therapies without the health-care provider having examined the partner. In other words, EPT is a convenient, fast and private way for patients to help their sexual partners get treated.  ° °Chlamydia and gonorrhea are bacterial infections you get from having sex with a person who is already infected. Trichomoniasis (or “trich”) is a very common sexually transmitted infection (STI) that is caused by infection with a protozoan parasite called Trichomonas vaginalis.  Many people with these infections don’t know it because they feel fine, but without treatment these infections can cause serious health problems, such as pelvic inflammatory disease, ectopic pregnancy, infertility and increased risk of HIV.  ° °It is important to get treated as soon as possible to protect your health, to avoid spreading these infections to others, and to prevent yourself from becoming re-infected. The good news is these infections can be easily cured with proper antibiotic medicine. The best way to take care of your self is to see a doctor or go to your local health department. If you are not able to see a doctor or other medical provider, you should take EPT.  ° ° °Recommended Medication: °EPT for Chlamydia:  Azithromycin (Zithromax) 1 gram orally in a single dose °EPT for Gonorrhea:  Cefixime (Suprax) 400 milligrams orally in a single dose PLUS azithromycin (Zithromax) 1 gram orally in a single dose °EPT for  Trichomoniasis:  Metronidazole (Flagyl) 2 grams orally in a single dose ° ° °These medicines are very safe. However, you should not take them if you have ever had an allergic reaction (like a rash) to any of these medicines: azithromycin (Zithromax), erythromycin, clarithromycin (Biaxin), metronidazole (Flagyl), tinidazole (Tindimax). If you are uncertain about whether you have an allergy, call your medical provider or pharmacist before taking this medicine. If you have a serious, long-term illness like kidney, liver or heart disease, colitis or stomach problems, or you are currently taking other prescription medication, talk to your provider before taking this medication.  ° °Women: If you have lower belly pain, pain during sex, vomiting, or a fever, do not take this medicine. Instead, you should see a medical provider to be certain you do not have pelvic inflammatory disease (PID). PID can be serious and lead to infertility, pregnancy problems or chronic pelvic pain.  ° °Pregnant Women: It is very important for you to see a doctor to get pregnancy services and pre-natal care. These antibiotics for EPT are safe for pregnant women, but you still need to see a medical provider as soon as possible. It is also important to note that Doxycycline is an alternative therapy for chlamydia, but it should not be taken by someone who is pregnant.  ° °Men: If you have pain or swelling in the testicles or a fever, do not take this medicine and see a medical provider.    ° °Men who have sex with men (MSM): MSM in [place] continue to experience high rates of syphilis and HIV. Many MSM with gonorrhea or   chlamydia could also have syphilis and/or HIV and not know it. If you are a man who has sex with other men, it is very important that you see a medical provider and are tested for HIV and syphilis. EPT is not recommended for gonorrhea for MSM.  Recommended treatment for gonorrhea for MSM is Rocephin (shot) AND azithromycin  due to decreased cure rate.  Please see your medical provider if this is the case.    Along with this information sheet is a prescription for the medicine. If you receive a prescription it will be in your name and will indicate your date of birth, or it will be in the name of Expedited Partner Therapy.   In either case, you can have the prescription filled at a pharmacy. You will be responsible for the cost of the medicine, unless you have prescription drug coverage. In that case, you could provide your name so the pharmacy could bill your health plan.   Take the medication as directed. Some people will have a mild, upset stomach, which does not last long. AVOID alcohol 24 hours after taking metronidazole (Flagyl) to reduce the possibility of a disulfiram-like reaction (severe vomiting and abdominal pain).  After taking the medicine, do not have sex for 7 days. Do not share this medicine or give it to anyone else. It is important to tell everyone you have had sex with in the last 60 days that they need to go and get tested for sexually transmitted infections.   Ways to prevent these and other sexually transmitted infections (STIs):    Abstain from sex. This is the only sure way to avoid getting an STI.   Use barrier methods, such as condoms, consistently and correctly.   Limit the number of sexual partners.   Have regular physical exams, including testing for STIs.   For more information about EPT or other issues pertaining to an STI, please contact your medical provider or the [facility] at [phone] or [email or web address]    [facility]    [facility]       Phone: [phone]  [facility] [place] Phone: [phone]  [facility] [place]  Phone: [phone]  Center for  [facility] [place]  Phone: [phone]  [facility] [place]  Phone: [phone]  [facility]       Phone: [phone]  [facility]    Phone: [phone]   [facility]    Phone: [phone]  [facility]    Phone: [phone]  [facility]    Phone: [phone]  [facility]    Phone: [phone]  [facility]       Phone: [phone]   [facility]  Phone: [phone]  [facility]    Phone: [phone]  [facility]   Phone: [phone]  [facility]      Phone: [phone]  [facility]    Phone: [phone]

## 2017-07-29 NOTE — MAU Note (Signed)
Pt reports she started having cramping at work. Had positive pregnancy test at health dept.Denies any vaginal bleeding or abnormal discharge.

## 2017-07-29 NOTE — MAU Provider Note (Signed)
Chief Complaint: Abdominal Pain  SUBJECTIVE HPI: Peggy [name] is a 31 y.o. G3P2002 at 2644w0d by LMP who presents to maternity admissions reporting abdominal cramping that started this morning around 1100 while at work- has not taken anything for the pain. She rates the pain a 6/10. She reports that she slipped and fell 2 days ago on black ice but did not have any pain after fall. Has not been seen for care during this pregnancy- unsure about who she wants to see. She denies vaginal bleeding, vaginal itching/burning, urinary symptoms, h/a, dizziness, n/v, or fever/chills.    History reviewed. No pertinent past medical history. Past Surgical History:  Procedure Laterality Date  . COLPOSCOPY     Social History   Socioeconomic History  . Marital status: Single    Spouse name: Not on file  . Number of children: Not on file  . Years of education: Not on file  . Highest education level: Not on file  Social Needs  . Financial resource strain: Not on file  . Food insecurity - worry: Not on file  . Food insecurity - inability: Not on file  . Transportation needs - medical: Not on file  . Transportation needs - non-medical: Not on file  Occupational History  . Not on file  Tobacco Use  . Smoking status: Current Every Day Smoker    Packs/day: 0.50    Types: Cigarettes  Substance and Sexual Activity  . Alcohol use: Yes    Comment: occasional   . Drug use: No  . Sexual activity: Yes    Birth control/protection: Implant  Other Topics Concern  . Not on file  Social History Narrative  . Not on file   No current facility-administered medications on file prior to encounter.    Current Outpatient Medications on File Prior to Encounter  Medication Sig Dispense Refill  . etonogestrel (IMPLANON) 68 MG IMPL implant Inject 68 mg into the skin once.    . Tetrahydrozoline HCl (EYE DROPS OP) Apply 1-2 drops to eye once as needed (for eye irritation).    . traMADol (ULTRAM) 50 MG tablet Take 1  tablet (50 mg total) by mouth every 6 (six) hours as needed. 15 tablet 0  . trimethoprim-polymyxin b (POLYTRIM) ophthalmic solution Place 2 drops into the right eye every 4 (four) hours. For one week 10 mL 0   Allergies  Allergen Reactions  . Aspirin Swelling    ORAL SWELLING    ROS:  Review of Systems  Constitutional: Negative.   Respiratory: Negative.   Cardiovascular: Negative.   Gastrointestinal: Positive for abdominal pain. Negative for constipation, diarrhea, nausea and vomiting.  Genitourinary: Negative for difficulty urinating, dysuria, frequency, pelvic pain, vaginal bleeding, vaginal discharge and vaginal pain.  Musculoskeletal: Negative.   Skin: Negative.   Neurological: Negative.   Psychiatric/Behavioral: Negative.    I have reviewed patient's Past Medical Hx, Surgical Hx, Family Hx, Social Hx, medications and allergies.   Physical Exam   Patient Vitals for the past 24 hrs:  BP Temp Pulse Resp Height Weight  07/29/17 2007 138/82 - 96 - - -  07/29/17 1738 134/79 98 F (36.7 C) - 18 5' 6.5" (1.689 m) 224 lb (101.6 kg)   Constitutional: Well-developed, well-nourished female in no acute distress.  Cardiovascular: normal rate Respiratory: normal effort GI: Abd soft, non-tender. Pos BS x 4 MS: Extremities nontender, no edema, normal ROM Neurologic: Alert and oriented x 4.  GU: Neg CVAT.  PELVIC EXAM: Cervix pink, visually closed,  without lesion, scant white creamy discharge, vaginal walls and external genitalia normal Bimanual exam: Cervix 0/long/high, firm, anterior, neg CMT, uterus nontender, nonenlarged, adnexa without tenderness, enlargement, or mass  LAB RESULTS Results for orders placed or performed during the hospital encounter of 07/29/17 (from the past 24 hour(s))  Urinalysis, Routine w reflex microscopic     Status: Abnormal   Collection Time: 07/29/17  5:35 PM  Result Value Ref Range   Color, Urine YELLOW YELLOW   APPearance HAZY (A) CLEAR   Specific  Gravity, Urine 1.024 1.005 - 1.030   pH 5.0 5.0 - 8.0   Glucose, UA NEGATIVE NEGATIVE mg/dL   Hgb urine dipstick NEGATIVE NEGATIVE   Bilirubin Urine NEGATIVE NEGATIVE   Ketones, ur 20 (A) NEGATIVE mg/dL   Protein, ur NEGATIVE NEGATIVE mg/dL   Nitrite NEGATIVE NEGATIVE   Leukocytes, UA SMALL (A) NEGATIVE   RBC / HPF 0-5 0 - 5 RBC/hpf   WBC, UA 6-30 0 - 5 WBC/hpf   Bacteria, UA NONE SEEN NONE SEEN   Squamous Epithelial / LPF 0-5 (A) NONE SEEN   Mucus PRESENT   Pregnancy, urine POC     Status: Abnormal   Collection Time: 07/29/17  5:45 PM  Result Value Ref Range   Preg Test, Ur POSITIVE (A) NEGATIVE  CBC     Status: Abnormal   Collection Time: 07/29/17  6:22 PM  Result Value Ref Range   WBC 10.7 (H) 4.0 - 10.5 K/uL   RBC 4.96 3.87 - 5.11 MIL/uL   Hemoglobin 14.0 12.0 - 15.0 g/dL   HCT 96.0 45.4 - 09.8 %   MCV 82.9 78.0 - 100.0 fL   MCH 28.2 26.0 - 34.0 pg   MCHC 34.1 30.0 - 36.0 g/dL   RDW 11.9 14.7 - 82.9 %   Platelets 213 150 - 400 K/uL  ABO/Rh     Status: None   Collection Time: 07/29/17  6:22 PM  Result Value Ref Range   ABO/RH(D) O POS   hCG, quantitative, pregnancy     Status: Abnormal   Collection Time: 07/29/17  6:22 PM  Result Value Ref Range   hCG, Beta Chain, Quant, S 32,532 (H) <5 mIU/mL  Wet prep, genital     Status: Abnormal   Collection Time: 07/29/17  6:40 PM  Result Value Ref Range   Yeast Wet Prep HPF POC NONE SEEN NONE SEEN   Trich, Wet Prep PRESENT (A) NONE SEEN   Clue Cells Wet Prep HPF POC PRESENT (A) NONE SEEN   WBC, Wet Prep HPF POC MODERATE (A) NONE SEEN   Sperm NONE SEEN      IMAGING US Ob Comp Less 14 Wks  Result Date: 07/29/2017 CLINICAL DATA:  Vaginal bleeding and cramping. Gestational age by LMP of 6 weeks 0 days. EXAM: OBSTETRIC <14 WK [place] AND TRANSVAGINAL OB US TECHNIQUE: Both transabdominal and transvaginal ultrasound examinations were performed for complete evaluation of the gestation as well as the maternal uterus, adnexal regions,  and pelvic cul-de-sac. Transvaginal technique was performed to assess early pregnancy. COMPARISON:  None. FINDINGS: Intrauterine gestational sac: Single Yolk sac:  Visualized. Embryo:  Visualized. Cardiac Activity: Visualized. Heart Rate: 107  bpm CRL:  3  mm   5 w   6 d                  [place] EDC: 03/25/2018 Subchorionic hemorrhage:  None visualized. Maternal uterus/adnexae: Small left ovarian corpus luteum noted. Normal appearance of left ovary. No  adnexal mass or abnormal free fluid identified. IMPRESSION: Single living IUP measuring 5 weeks 6 days, which is concordant with LMP. No significant maternal uterine or adnexal abnormality identified. Electronically Signed   By: Myles RosenthalJohn  Stahl M.D.   On: 07/29/2017 19:53   [place] Ob Transvaginal  Result Date: 07/29/2017 CLINICAL DATA:  Vaginal bleeding and cramping. Gestational age by LMP of 6 weeks 0 days. EXAM: OBSTETRIC <14 WK US AND TRANSVAGINAL OB US TECHNIQUE: Both transabdominal and transvaginal ultrasound examinations were performed for complete evaluation of the gestation as well as the maternal uterus, adnexal regions, and pelvic cul-de-sac. Transvaginal technique was performed to assess early pregnancy. COMPARISON:  None. FINDINGS: Intrauterine gestational sac: Single Yolk sac:  Visualized. Embryo:  Visualized. Cardiac Activity: Visualized. Heart Rate: 107  bpm CRL:  3  mm   5 w   6 d                  US EDC: 03/25/2018 Subchorionic hemorrhage:  None visualized. Maternal uterus/adnexae: Small left ovarian corpus luteum noted. Normal appearance of left ovary. No adnexal mass or abnormal free fluid identified. IMPRESSION: Single living IUP measuring 5 weeks 6 days, which is concordant with LMP. No significant maternal uterine or adnexal abnormality identified. Electronically Signed   By: Myles RosenthalJohn  Stahl M.D.   On: 07/29/2017 19:53    MAU Management/MDM: Ordered labs and reviewed results.  HCG ABO/Rh Urinalysis Wet prep - Trich and BV present.   GC/C    Treatments in MAU included 2,000mg  Flaygl for trich. Pt told that partner needed to be treated and expedited partner treatment prescription given with instructions.  Pt discharged with no intercourse precautions until partner is fully treated.  ASSESSMENT 1. Normal IUP (intrauterine pregnancy) on prenatal ultrasound, first trimester   2. Bleeding in early pregnancy   3. Trichomonal vaginitis in pregnancy in first trimester     PLAN Discharge home Rx for Flagyl for BV  Rx for prenatal vitamins  Expedited partner Rx given  Follow up and schedule initial OB appointment and return to MAU as needed.    Allergies as of 07/29/2017      Reactions   Aspirin Swelling   ORAL SWELLING      Medication List    STOP taking these medications   EYE DROPS OP   IMPLANON 68 MG Impl implant Generic drug:  etonogestrel   traMADol 50 MG tablet Commonly known as:  ULTRAM   trimethoprim-polymyxin b ophthalmic solution Commonly known as:  POLYTRIM     TAKE these medications   metroNIDAZOLE 500 MG tablet Commonly known as:  FLAGYL Take 1 tablet (500 mg total) by mouth 2 (two) times daily.   PREPLUS 27-1 MG Tabs Take 1 tablet by mouth daily.       Peggy [name]  Certified Nurse-Midwife 07/29/2017  6:25 PM

## 2017-07-30 LAB — HIV ANTIBODY (ROUTINE TESTING W REFLEX): HIV Screen 4th Generation wRfx: NONREACTIVE

## 2017-07-30 LAB — GC/CHLAMYDIA PROBE AMP (~~LOC~~) NOT AT ARMC
Chlamydia: NEGATIVE
Neisseria Gonorrhea: NEGATIVE

## 2017-08-17 NOTE — L&D Delivery Note (Signed)
Patient is a 32 y.o. 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  She progressed with augmentation (Pitocin) to complete and pushed 4 minutes to deliver.  Cord clamping delayed by several minutes then clamped by CNM and cut by FOB.  Placenta intact and spontaneous, bleeding minimal- sent to pathology for marginal cord insertion. Mom and baby stable prior to transfer to postpartum. She plans on breastfeeding and formula feeding. She requests Depo for birth control.  Delivery Note At 2:34 PM a viable and healthy female was delivered via Vaginal, Spontaneous (Presentation: LOA).  APGAR: 8, 9; weight pending .   Placenta intact and spontaneous, bleeding minimal- sent to pathology for marginal cord insertion. 3VCord:  with the following complications: unknown marginal cord insertion.  Anesthesia: IV Fentanyl  Episiotomy: None Lacerations: None Suture Repair: None Est. Blood Loss (mL):  100  Mom to postpartum.  Baby to Couplet care / Skin to Skin.  Sharyon CableVeronica C Olman Yono CNM 03/17/2018, 3:12 PM

## 2017-09-02 ENCOUNTER — Ambulatory Visit: Payer: Medicaid Other

## 2017-09-02 ENCOUNTER — Other Ambulatory Visit (HOSPITAL_COMMUNITY)
Admission: RE | Admit: 2017-09-02 | Discharge: 2017-09-02 | Disposition: A | Discharge status: Home / Self Care | Payer: Medicaid Other | Source: Ambulatory Visit | Attending: Certified Nurse Midwife | Admitting: Certified Nurse Midwife

## 2017-09-02 ENCOUNTER — Ambulatory Visit (INDEPENDENT_AMBULATORY_CARE_PROVIDER_SITE_OTHER): Payer: Medicaid Other | Admitting: Certified Nurse Midwife

## 2017-09-02 ENCOUNTER — Encounter: Payer: Self-pay | Admitting: Certified Nurse Midwife

## 2017-09-02 VITALS — BP 141/88 | HR 85 | Wt 212.8 lb

## 2017-09-02 DIAGNOSIS — O0990 Supervision of high risk pregnancy, unspecified, unspecified trimester: Secondary | ICD-10-CM | POA: Insufficient documentation

## 2017-09-02 DIAGNOSIS — O3680X0 Pregnancy with inconclusive fetal viability, not applicable or unspecified: Secondary | ICD-10-CM

## 2017-09-02 DIAGNOSIS — O10911 Unspecified pre-existing hypertension complicating pregnancy, first trimester: Secondary | ICD-10-CM | POA: Diagnosis not present

## 2017-09-02 DIAGNOSIS — Q513 Bicornate uterus: Secondary | ICD-10-CM | POA: Diagnosis not present

## 2017-09-02 DIAGNOSIS — O30041 Twin pregnancy, dichorionic/diamniotic, first trimester: Secondary | ICD-10-CM | POA: Diagnosis not present

## 2017-09-02 DIAGNOSIS — O10919 Unspecified pre-existing hypertension complicating pregnancy, unspecified trimester: Secondary | ICD-10-CM | POA: Insufficient documentation

## 2017-09-02 DIAGNOSIS — Z3480 Encounter for supervision of other normal pregnancy, unspecified trimester: Secondary | ICD-10-CM

## 2017-09-02 NOTE — Progress Notes (Signed)
Pt c/o cold symptoms, reports taking Dayquil.

## 2017-09-02 NOTE — Progress Notes (Signed)
Subjective:   Peggy [name] is a 32 y.o. G3P2002 at 48w1dby LMP, early ultrasound being seen today for her first obstetrical visit.  Her obstetrical history is significant for obesity, smoker and twin pregnancy with CHTN. Patient does intend to breast feed. Pregnancy history fully reviewed.  Employed as a hSecretary/administrator  Hx of twins in the family; her father, grandparents.    Patient reports no complaints.  HISTORY: Obstetric History   G3   P2   T2   P0   A0   L2    SAB0   TAB0   Ectopic0   Multiple0   Live Births2     # Outcome Date GA Lbr Len/2nd Weight Sex Delivery Anes PTL Lv  3 Current           2 Term      Vag-Spont   LIV  1 Term      Vag-Spont   LIV     History reviewed. No pertinent past medical history. Past Surgical History:  Procedure Laterality Date  . COLPOSCOPY     Family History  Problem Relation Age of Onset  . Heart disease Mother   . Hypertension Mother   . Asthma Daughter   . Asthma Son   . Diabetes Maternal Grandmother    Social History   Tobacco Use  . Smoking status: Current Every Day Smoker    Packs/day: 0.25    Types: Cigarettes  . Smokeless tobacco: Never Used  Substance Use Topics  . Alcohol use: No    Frequency: Never  . Drug use: No   Allergies  Allergen Reactions  . Aspirin Swelling    ORAL SWELLING   Current Outpatient Medications on File Prior to Visit  Medication Sig Dispense Refill  . Prenatal Vit-Fe Fumarate-FA (PREPLUS) 27-1 MG TABS Take 1 tablet by mouth daily. 30 tablet 13  . metroNIDAZOLE (FLAGYL) 500 MG tablet Take 1 tablet (500 mg total) by mouth 2 (two) times daily. 14 tablet 0   No current facility-administered medications on file prior to visit.      Exam   Vitals:   09/02/17 1323  BP: (!) 141/88  Pulse: 85  Weight: 212 lb 12.8 oz (96.5 kg)   Fetal Heart Rate (bpm): 160; doppler  Uterus:     Pelvic Exam: Perineum: no hemorrhoids, normal perineum   Vulva: normal external genitalia, no lesions   Vagina:  normal mucosa, normal discharge   Cervix: no lesions and normal, pap smear done.    Adnexa: normal adnexa and no mass, fullness, tenderness   Bony Pelvis: average  System: General: well-developed, well-nourished female in no acute distress   Breast:  normal appearance, no masses or tenderness   Skin: normal coloration and turgor, no rashes   Neurologic: oriented, normal, negative, normal mood   Extremities: normal strength, tone, and muscle mass, ROM of all joints is normal   HEENT PERRLA, extraocular movement intact and sclera clear, anicteric   Mouth/Teeth mucous membranes moist, pharynx normal without lesions and dental hygiene good   Neck supple and no masses   Cardiovascular: regular rate and rhythm   Respiratory:  no respiratory distress, normal breath sounds   Abdomen: soft, non-tender; bowel sounds normal; no masses,  no organomegaly     Assessment:   Pregnancy: [id number] Active Problem List   Diagnosis Date Noted  . Twin pregnancy 09/03/2017  . 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 Supervision of high risk pregnancy, antepartum 09/02/2017  .  Chronic hypertension during pregnancy, antepartum 09/02/2017     Plan:  1. Supervision of high risk pregnancy, antepartum     - Cervicovaginal ancillary only - Culture, OB Urine - Cytology - PAP - Genetic Screening - Hemoglobin A1c - Hemoglobinopathy evaluation - Obstetric Panel, Including HIV - VITAMIN D 25 Hydroxy (Vit-D Deficiency, Fractures) - Cystic Fibrosis Mutation 97 - Comp Met (CMET) - Protein / creatinine ratio, urine - [place] MFM OB DETAIL +14 WK; Future  2. Supervision of other normal pregnancy, antepartum     3. Chronic hypertension during pregnancy, antepartum    - [place] MFM OB DETAIL +14 WK; Future  4. Encounter to determine fetal viability of pregnancy, single or unspecified fetus     - US OB Limited   Initial labs drawn. Continue prenatal vitamins. Genetic Screening discussed, NIPS:  ordered. Ultrasound discussed; fetal anatomic survey: ordered. Problem list reviewed and updated. The nature of [place] with multiple MDs and other Advanced Practice Providers was explained to patient; also emphasized that residents, students are part of our team. Routine obstetric precautions reviewed. Return in about 4 weeks (around 09/30/2017).     Kandis Cocking, [place] for [employer], [place]

## 2017-09-03 DIAGNOSIS — Q513 Bicornate uterus: Secondary | ICD-10-CM | POA: Insufficient documentation

## 2017-09-03 DIAGNOSIS — O30009 Twin pregnancy, unspecified number of placenta and unspecified number of amniotic sacs, unspecified trimester: Secondary | ICD-10-CM | POA: Insufficient documentation

## 2017-09-03 LAB — PROTEIN / CREATININE RATIO, URINE
CREATININE, UR: 256.3 mg/dL
Protein, Ur: 21 mg/dL
Protein/Creat Ratio: 82 mg/g creat (ref 0–200)

## 2017-09-03 LAB — CERVICOVAGINAL ANCILLARY ONLY
BACTERIAL VAGINITIS: NEGATIVE
Candida vaginitis: NEGATIVE
Chlamydia: NEGATIVE
Neisseria Gonorrhea: NEGATIVE
TRICH (WINDOWPATH): NEGATIVE

## 2017-09-04 LAB — URINE CULTURE, OB REFLEX

## 2017-09-04 LAB — CULTURE, OB URINE

## 2017-09-07 LAB — CYTOLOGY - PAP
Diagnosis: NEGATIVE
HPV: NOT DETECTED

## 2017-09-08 ENCOUNTER — Other Ambulatory Visit: Payer: Self-pay | Admitting: Certified Nurse Midwife

## 2017-09-08 DIAGNOSIS — E559 Vitamin D deficiency, unspecified: Secondary | ICD-10-CM | POA: Insufficient documentation

## 2017-09-08 DIAGNOSIS — O0990 Supervision of high risk pregnancy, unspecified, unspecified trimester: Secondary | ICD-10-CM

## 2017-09-08 LAB — OBSTETRIC PANEL, INCLUDING HIV
Antibody Screen: NEGATIVE
Basophils Absolute: 0 10*3/uL (ref 0.0–0.2)
Basos: 1 %
EOS (ABSOLUTE): 0.2 10*3/uL (ref 0.0–0.4)
EOS: 2 %
HEMOGLOBIN: 14.1 g/dL (ref 11.1–15.9)
HEP B S AG: NEGATIVE
HIV Screen 4th Generation wRfx: NONREACTIVE
Hematocrit: 42.1 % (ref 34.0–46.6)
IMMATURE GRANS (ABS): 0 10*3/uL (ref 0.0–0.1)
IMMATURE GRANULOCYTES: 0 %
LYMPHS ABS: 2.7 10*3/uL (ref 0.7–3.1)
LYMPHS: 31 %
MCH: 27.8 pg (ref 26.6–33.0)
MCHC: 33.5 g/dL (ref 31.5–35.7)
MCV: 83 fL (ref 79–97)
Monocytes Absolute: 0.6 10*3/uL (ref 0.1–0.9)
Monocytes: 6 %
NEUTROS PCT: 60 %
Neutrophils Absolute: 5.3 10*3/uL (ref 1.4–7.0)
Platelets: 197 10*3/uL (ref 150–379)
RBC: 5.08 x10E6/uL (ref 3.77–5.28)
RDW: 13.2 % (ref 12.3–15.4)
RH TYPE: POSITIVE
RPR Ser Ql: NONREACTIVE
Rubella Antibodies, IGG: 5.85 index (ref >0.99)
WBC: 8.8 10*3/uL (ref 3.4–10.8)

## 2017-09-08 LAB — HEMOGLOBINOPATHY EVALUATION
HEMOGLOBIN A2 QUANTITATION: 2.9 % (ref 1.8–3.2)
HEMOGLOBIN F QUANTITATION: 0 % (ref 0.0–2.0)
HGB C: 39.7 % — ABNORMAL HIGH
HGB S: 0 %
HGB VARIANT: 0 %
Hgb A: 57.4 % — ABNORMAL LOW (ref 96.4–98.8)

## 2017-09-08 LAB — COMPREHENSIVE METABOLIC PANEL
ALBUMIN: 4.5 g/dL (ref 3.5–5.5)
ALT: 29 IU/L (ref 0–32)
AST: 20 IU/L (ref 0–40)
Albumin/Globulin Ratio: 1.8 (ref 1.2–2.2)
Alkaline Phosphatase: 58 IU/L (ref 39–117)
BUN / CREAT RATIO: 12 (ref 9–23)
BUN: 7 mg/dL (ref 6–20)
Bilirubin Total: 0.4 mg/dL (ref 0.0–1.2)
CALCIUM: 10 mg/dL (ref 8.7–10.2)
CO2: 19 mmol/L — AB (ref 20–29)
CREATININE: 0.59 mg/dL (ref 0.57–1.00)
Chloride: 102 mmol/L (ref 96–106)
GFR calc Af Amer: 140 mL/min/{1.73_m2} (ref >59)
GFR, EST NON AFRICAN AMERICAN: 122 mL/min/{1.73_m2} (ref >59)
GLOBULIN, TOTAL: 2.5 g/dL (ref 1.5–4.5)
Glucose: 97 mg/dL (ref 65–99)
Potassium: 4.3 mmol/L (ref 3.5–5.2)
SODIUM: 137 mmol/L (ref 134–144)
Total Protein: 7 g/dL (ref 6.0–8.5)

## 2017-09-08 LAB — VITAMIN D 25 HYDROXY (VIT D DEFICIENCY, FRACTURES): Vit D, 25-Hydroxy: 13.4 ng/mL — ABNORMAL LOW (ref 30.0–100.0)

## 2017-09-08 LAB — HEMOGLOBIN A1C
Est. average glucose Bld gHb Est-mCnc: 114 mg/dL
HEMOGLOBIN A1C: 5.6 % (ref 4.8–5.6)

## 2017-09-08 MED ORDER — VITAMIN D (ERGOCALCIFEROL) 1.25 MG (50000 UNIT) PO CAPS: 50000.0000 [IU] | ORAL_CAPSULE | ORAL | 2 refills | Status: DC

## 2017-09-09 ENCOUNTER — Telehealth: Payer: Self-pay | Admitting: *Deleted

## 2017-09-09 NOTE — Telephone Encounter (Signed)
Pt called to office asking about lab results she saw in MyChart.  Attempt to return pt call. LM on VM to call back.

## 2017-09-10 LAB — CYSTIC FIBROSIS MUTATION 97: GENE DIS ANAL CARRIER INTERP BLD/T-IMP: NOT DETECTED

## 2017-09-13 ENCOUNTER — Other Ambulatory Visit: Payer: Self-pay | Admitting: Certified Nurse Midwife

## 2017-09-13 DIAGNOSIS — O0990 Supervision of high risk pregnancy, unspecified, unspecified trimester: Secondary | ICD-10-CM

## 2017-09-14 ENCOUNTER — Telehealth: Payer: Self-pay | Admitting: Pediatrics

## 2017-09-14 NOTE — Telephone Encounter (Signed)
Pt called inquiring about Panorama results.  I spoke with Detar NorthMarni, results d/t be in by 09/14/17.  I advised patient that I would call her tomorrow with the results. She voiced understanding and gratitude.

## 2017-09-15 ENCOUNTER — Encounter: Payer: Self-pay | Admitting: *Deleted

## 2017-09-15 NOTE — Telephone Encounter (Signed)
Patient notified of Panorama results.  She will stop by office to pick up a copy tomorrow.

## 2017-09-30 ENCOUNTER — Ambulatory Visit (INDEPENDENT_AMBULATORY_CARE_PROVIDER_SITE_OTHER): Payer: Medicaid Other | Admitting: Certified Nurse Midwife

## 2017-09-30 ENCOUNTER — Other Ambulatory Visit: Payer: Self-pay

## 2017-09-30 ENCOUNTER — Encounter: Payer: Self-pay | Admitting: Certified Nurse Midwife

## 2017-09-30 VITALS — BP 131/85 | HR 111 | Wt 213.4 lb

## 2017-09-30 DIAGNOSIS — E559 Vitamin D deficiency, unspecified: Secondary | ICD-10-CM | POA: Diagnosis not present

## 2017-09-30 DIAGNOSIS — O0990 Supervision of high risk pregnancy, unspecified, unspecified trimester: Secondary | ICD-10-CM | POA: Diagnosis not present

## 2017-09-30 DIAGNOSIS — O30032 Twin pregnancy, monochorionic/diamniotic, second trimester: Secondary | ICD-10-CM

## 2017-09-30 DIAGNOSIS — O10919 Unspecified pre-existing hypertension complicating pregnancy, unspecified trimester: Secondary | ICD-10-CM

## 2017-09-30 NOTE — Progress Notes (Signed)
   PRENATAL VISIT NOTE  Subjective:  Peggy [name] is a 32 y.o. G3P2002 at 47w0dbeing seen today for ongoing prenatal care.  She is currently monitored for the following issues for this high-risk pregnancy and has Supervision of high risk pregnancy, antepartum; Chronic hypertension during pregnancy, antepartum; Twin pregnancy; Bicornate uterus; and Vitamin D deficiency on their problem list.  Patient reports no complaints.  Contractions: Not present. Vag. Bleeding: None.  Movement: Present. Denies leaking of fluid.   The following portions of the patient's history were reviewed and updated as appropriate: allergies, current medications, past family history, past medical history, past social history, past surgical history and problem list. Problem list updated.  Objective:   Vitals:   09/30/17 1317  BP: 131/85  Pulse: (!) 111  Weight: 213 lb 6.4 oz (96.8 kg)    Fetal Status: Fetal Heart Rate (bpm): A: 150; B: 158; doppler   Movement: Present     General:  Alert, oriented and cooperative. Patient is in no acute distress.  Skin: Skin is warm and dry. No rash noted.   Cardiovascular: Normal heart rate noted  Respiratory: Normal respiratory effort, no problems with respiration noted  Abdomen: Soft, gravid, appropriate for gestational age.  Pain/Pressure: Absent     Pelvic: Cervical exam deferred        Extremities: Normal range of motion.  Edema: None  Mental Status:  Normal mood and affect. Normal behavior. Normal judgment and thought content.   Assessment and Plan:  Pregnancy: G3P2002 at 167w0d1. Supervision of high risk pregnancy, antepartum     Doing well.  Anatomy scans scheduled previously.  Met with SW in office today.  - AFP, Serum, Open Spina Bifida  2. Vitamin D deficiency     Taking weekly vitamin D  3. Monochorionic diamniotic twin gestation in second trimester     Panorama normal.  4. Chronic hypertension during pregnancy, antepartum     Stable blood pressure,  no meds, cannot take Aspirin  Preterm labor symptoms and general obstetric precautions including but not limited to vaginal bleeding, contractions, leaking of fluid and fetal movement were reviewed in detail with the patient. Please refer to After Visit Summary for other counseling recommendations.  Return in about 4 weeks (around 10/28/2017) [facility].   RaMorene CrockerCNM

## 2017-09-30 NOTE — Progress Notes (Signed)
ROB. No problems to report.

## 2017-10-05 LAB — AFP, SERUM, OPEN SPINA BIFIDA
AFP MOM: 1.73
AFP VALUE AFPOSL: 39.7 ng/mL
Gest. Age on Collection Date: 15 weeks
MATERNAL AGE AT EDD: 34.3 a
OSBR Risk 1 IN: 3028
Test Results:: NEGATIVE
WEIGHT: 213 [lb_av]

## 2017-10-06 ENCOUNTER — Other Ambulatory Visit: Payer: Self-pay | Admitting: Certified Nurse Midwife

## 2017-10-06 DIAGNOSIS — O0990 Supervision of high risk pregnancy, unspecified, unspecified trimester: Secondary | ICD-10-CM

## 2017-10-20 ENCOUNTER — Encounter (HOSPITAL_COMMUNITY): Payer: Self-pay | Admitting: Certified Nurse Midwife

## 2017-10-28 ENCOUNTER — Ambulatory Visit (HOSPITAL_COMMUNITY): Admission: RE | Admit: 2017-10-28 | Payer: Medicaid Other | Source: Ambulatory Visit

## 2017-10-28 ENCOUNTER — Encounter: Payer: Medicaid Other | Admitting: Certified Nurse Midwife

## 2017-11-01 ENCOUNTER — Telehealth: Payer: Self-pay

## 2017-11-01 NOTE — Telephone Encounter (Signed)
TT to pt regarding message  Pt needed ROB appt scheduled  Since anatomy is scheduled tomorrow.  11/02/17.  Sent to scheduling.

## 2017-11-02 ENCOUNTER — Encounter (HOSPITAL_COMMUNITY): Payer: Self-pay

## 2017-11-02 ENCOUNTER — Encounter: Payer: Medicaid Other | Admitting: Obstetrics and Gynecology

## 2017-11-02 ENCOUNTER — Ambulatory Visit (HOSPITAL_COMMUNITY)
Admission: RE | Admit: 2017-11-02 | Discharge: 2017-11-02 | Disposition: A | Discharge status: Home / Self Care | Payer: Medicaid Other | Source: Ambulatory Visit | Attending: Certified Nurse Midwife | Admitting: Certified Nurse Midwife

## 2017-11-02 ENCOUNTER — Other Ambulatory Visit: Payer: Self-pay | Admitting: Certified Nurse Midwife

## 2017-11-02 DIAGNOSIS — Z3689 Encounter for other specified antenatal screening: Secondary | ICD-10-CM

## 2017-11-02 DIAGNOSIS — O30041 Twin pregnancy, dichorionic/diamniotic, first trimester: Secondary | ICD-10-CM

## 2017-11-02 DIAGNOSIS — O99332 Smoking (tobacco) complicating pregnancy, second trimester: Secondary | ICD-10-CM

## 2017-11-02 DIAGNOSIS — O10012 Pre-existing essential hypertension complicating pregnancy, second trimester: Secondary | ICD-10-CM | POA: Insufficient documentation

## 2017-11-02 DIAGNOSIS — O10919 Unspecified pre-existing hypertension complicating pregnancy, unspecified trimester: Secondary | ICD-10-CM

## 2017-11-02 DIAGNOSIS — O09892 Supervision of other high risk pregnancies, second trimester: Secondary | ICD-10-CM | POA: Insufficient documentation

## 2017-11-02 DIAGNOSIS — E669 Obesity, unspecified: Secondary | ICD-10-CM | POA: Diagnosis not present

## 2017-11-02 DIAGNOSIS — O99212 Obesity complicating pregnancy, second trimester: Secondary | ICD-10-CM

## 2017-11-02 DIAGNOSIS — O0990 Supervision of high risk pregnancy, unspecified, unspecified trimester: Secondary | ICD-10-CM

## 2017-11-02 DIAGNOSIS — Z3A19 19 weeks gestation of pregnancy: Secondary | ICD-10-CM | POA: Diagnosis not present

## 2017-11-02 HISTORY — DX: Other specified health status: Z78.9

## 2017-11-03 ENCOUNTER — Other Ambulatory Visit: Payer: Self-pay | Admitting: Certified Nurse Midwife

## 2017-11-03 DIAGNOSIS — O10919 Unspecified pre-existing hypertension complicating pregnancy, unspecified trimester: Secondary | ICD-10-CM

## 2017-11-03 DIAGNOSIS — O0990 Supervision of high risk pregnancy, unspecified, unspecified trimester: Secondary | ICD-10-CM

## 2017-11-09 ENCOUNTER — Other Ambulatory Visit: Payer: Self-pay | Admitting: Certified Nurse Midwife

## 2017-11-12 ENCOUNTER — Other Ambulatory Visit: Payer: Self-pay | Admitting: Certified Nurse Midwife

## 2017-11-25 ENCOUNTER — Other Ambulatory Visit: Payer: Self-pay

## 2017-11-25 ENCOUNTER — Encounter: Payer: Self-pay | Admitting: Advanced Practice Midwife

## 2017-11-25 ENCOUNTER — Ambulatory Visit: Payer: Self-pay | Admitting: *Deleted

## 2017-11-25 ENCOUNTER — Ambulatory Visit (INDEPENDENT_AMBULATORY_CARE_PROVIDER_SITE_OTHER): Payer: Medicaid Other | Admitting: Advanced Practice Midwife

## 2017-11-25 VITALS — BP 130/84 | HR 92 | Wt 217.0 lb

## 2017-11-25 DIAGNOSIS — O23592 Infection of other part of genital tract in pregnancy, second trimester: Secondary | ICD-10-CM

## 2017-11-25 DIAGNOSIS — O0990 Supervision of high risk pregnancy, unspecified, unspecified trimester: Secondary | ICD-10-CM

## 2017-11-25 DIAGNOSIS — Z3A23 23 weeks gestation of pregnancy: Secondary | ICD-10-CM

## 2017-11-25 DIAGNOSIS — Z1389 Encounter for screening for other disorder: Secondary | ICD-10-CM

## 2017-11-25 DIAGNOSIS — N760 Acute vaginitis: Secondary | ICD-10-CM

## 2017-11-25 DIAGNOSIS — B9689 Other specified bacterial agents as the cause of diseases classified elsewhere: Secondary | ICD-10-CM

## 2017-11-25 DIAGNOSIS — Z331 Pregnant state, incidental: Secondary | ICD-10-CM

## 2017-11-25 DIAGNOSIS — O10919 Unspecified pre-existing hypertension complicating pregnancy, unspecified trimester: Secondary | ICD-10-CM

## 2017-11-25 DIAGNOSIS — O10912 Unspecified pre-existing hypertension complicating pregnancy, second trimester: Secondary | ICD-10-CM

## 2017-11-25 LAB — POCT URINALYSIS DIPSTICK
GLUCOSE UA: NEGATIVE
Ketones, UA: NEGATIVE
LEUKOCYTES UA: NEGATIVE
Nitrite, UA: NEGATIVE
Protein, UA: NEGATIVE
RBC UA: NEGATIVE

## 2017-11-25 MED ORDER — METRONIDAZOLE 500 MG PO TABS: 500.0000 mg | ORAL_TABLET | Freq: Two times a day (BID) | ORAL | 0 refills | Status: DC

## 2017-11-25 NOTE — Patient Instructions (Signed)
Thereasa SoloLatise S Ostroff, I greatly value your feedback.  If you receive a survey following your visit with us today, we appreciate you taking the time to fill it out.  Thanks, Cathie BeamsFran Cresenzo-Dishmon, CNM     Second Trimester of Pregnancy The second trimester is from week 14 through week 27 (months 4 through 6). The second trimester is often a time when you feel your best. Your body has adjusted to being pregnant, and you begin to feel better physically. Usually, morning sickness has lessened or quit completely, you may have more energy, and you may have an increase in appetite. The second trimester is also a time when the fetus is growing rapidly. At the end of the sixth month, the fetus is about 9 inches long and weighs about 1 pounds. You will likely begin to feel the baby move (quickening) between 16 and 20 weeks of pregnancy. Body changes during your second trimester Your body continues to go through many changes during your second trimester. The changes vary from woman to woman.  Your weight will continue to increase. You will notice your lower abdomen bulging out.  You may begin to get stretch marks on your hips, abdomen, and breasts.  You may develop headaches that can be relieved by medicines. The medicines should be approved by your health care provider.  You may urinate more often because the fetus is pressing on your bladder.  You may develop or continue to have heartburn as a result of your pregnancy.  You may develop constipation because certain hormones are causing the muscles that push waste through your intestines to slow down.  You may develop hemorrhoids or swollen, bulging veins (varicose veins).  You may have back pain. This is caused by: ? Weight gain. ? Pregnancy hormones that are relaxing the joints in your pelvis. ? A shift in weight and the muscles that support your balance.  Your breasts will continue to grow and they will continue to become tender.  Your gums may  bleed and may be sensitive to brushing and flossing.  Dark spots or blotches (chloasma, mask of pregnancy) may develop on your face. This will likely fade after the baby is born.  A dark line from your belly button to the pubic area (linea nigra) may appear. This will likely fade after the baby is born.  You may have changes in your hair. These can include thickening of your hair, rapid growth, and changes in texture. Some women also have hair loss during or after pregnancy, or hair that feels dry or thin. Your hair will most likely return to normal after your baby is born.  What to expect at prenatal visits During a routine prenatal visit:  You will be weighed to make sure you and the fetus are growing normally.  Your blood pressure will be taken.  Your abdomen will be measured to track your baby's growth.  The fetal heartbeat will be listened to.  Any test results from the previous visit will be discussed.  Your health care provider may ask you:  How you are feeling.  If you are feeling the baby move.  If you have had any abnormal symptoms, such as leaking fluid, bleeding, severe headaches, or abdominal cramping.  If you are using any tobacco products, including cigarettes, chewing tobacco, and electronic cigarettes.  If you have any questions.  Other tests that may be performed during your second trimester include:  Blood tests that check for: ? Low iron levels (anemia). ?  High blood sugar that affects pregnant women (gestational diabetes) between 20 and 28 weeks. ? Rh antibodies. This is to check for a protein on red blood cells (Rh factor).  Urine tests to check for infections, diabetes, or protein in the urine.  An ultrasound to confirm the proper growth and development of the baby.  An amniocentesis to check for possible genetic problems.  Fetal screens for spina bifida and Down syndrome.  HIV (human immunodeficiency virus) testing. Routine prenatal testing  includes screening for HIV, unless you choose not to have this test.  Follow these instructions at home: Medicines  Follow your health care provider's instructions regarding medicine use. Specific medicines may be either safe or unsafe to take during pregnancy.  Take a prenatal vitamin that contains at least 600 micrograms (mcg) of folic acid.  If you develop constipation, try taking a stool softener if your health care provider approves. Eating and drinking  Eat a balanced diet that includes fresh fruits and vegetables, whole grains, good sources of protein such as meat, eggs, or tofu, and low-fat dairy. Your health care provider will help you determine the amount of weight gain that is right for you.  Avoid raw meat and uncooked cheese. These carry germs that can cause birth defects in the baby.  If you have low calcium intake from food, talk to your health care provider about whether you should take a daily calcium supplement.  Limit foods that are high in fat and processed sugars, such as fried and sweet foods.  To prevent constipation: ? Drink enough fluid to keep your urine clear or pale yellow. ? Eat foods that are high in fiber, such as fresh fruits and vegetables, whole grains, and beans. Activity  Exercise only as directed by your health care provider. Most women can continue their usual exercise routine during pregnancy. Try to exercise for 30 minutes at least 5 days a week. Stop exercising if you experience uterine contractions.  Avoid heavy lifting, wear low heel shoes, and practice good posture.  A sexual relationship may be continued unless your health care provider directs you otherwise. Relieving pain and discomfort  Wear a good support bra to prevent discomfort from breast tenderness.  Take warm sitz baths to soothe any pain or discomfort caused by hemorrhoids. Use hemorrhoid cream if your health care provider approves.  Rest with your legs elevated if you have  leg cramps or low back pain.  If you develop varicose veins, wear support hose. Elevate your feet for 15 minutes, 3-4 times a day. Limit salt in your diet. Prenatal Care  Write down your questions. Take them to your prenatal visits.  Keep all your prenatal visits as told by your health care provider. This is important. Safety  Wear your seat belt at all times when driving.  Make a list of emergency phone numbers, including numbers for family, friends, the hospital, and police and fire departments. General instructions  Ask your health care provider for a referral to a local prenatal education class. Begin classes no later than the beginning of month 6 of your pregnancy.  Ask for help if you have counseling or nutritional needs during pregnancy. Your health care provider can offer advice or refer you to specialists for help with various needs.  Do not use hot tubs, steam rooms, or saunas.  Do not douche or use tampons or scented sanitary pads.  Do not cross your legs for long periods of time.  Avoid cat litter boxes and  soil used by cats. These carry germs that can cause birth defects in the baby and possibly loss of the fetus by miscarriage or stillbirth.  Avoid all smoking, herbs, alcohol, and unprescribed drugs. Chemicals in these products can affect the formation and growth of the baby.  Do not use any products that contain nicotine or tobacco, such as cigarettes and e-cigarettes. If you need help quitting, ask your health care provider.  Visit your dentist if you have not gone yet during your pregnancy. Use a soft toothbrush to brush your teeth and be gentle when you floss. Contact a health care provider if:  You have dizziness.  You have mild pelvic cramps, pelvic pressure, or nagging pain in the abdominal area.  You have persistent nausea, vomiting, or diarrhea.  You have a bad smelling vaginal discharge.  You have pain when you urinate. Get help right away if:  You  have a fever.  You are leaking fluid from your vagina.  You have spotting or bleeding from your vagina.  You have severe abdominal cramping or pain.  You have rapid weight gain or weight loss.  You have shortness of breath with chest pain.  You notice sudden or extreme swelling of your face, hands, ankles, feet, or legs.  You have not felt your baby move in over an hour.  You have severe headaches that do not go away when you take medicine.  You have vision changes. Summary  The second trimester is from week 14 through week 27 (months 4 through 6). It is also a time when the fetus is growing rapidly.  Your body goes through many changes during pregnancy. The changes vary from woman to woman.  Avoid all smoking, herbs, alcohol, and unprescribed drugs. These chemicals affect the formation and growth your baby.  Do not use any tobacco products, such as cigarettes, chewing tobacco, and e-cigarettes. If you need help quitting, ask your health care provider.  Contact your health care provider if you have any questions. Keep all prenatal visits as told by your health care provider. This is important. This information is not intended to replace advice given to you by your health care provider. Make sure you discuss any questions you have with your health care provider.      CHILDBIRTH CLASSES [phone] is the phone number for Pregnancy Classes or hospital tours at [place] will be referred to  [email or web address] for more information on childbirth classes  At this site you may register for classes. You may sign up for a waiting list if classes are full. Please SIGN UP FOR THIS!.   When the waiting list becomes long, sometimes new classes can be added.

## 2017-11-25 NOTE — Progress Notes (Signed)
HIGH-RISK PREGNANCY VISIT Patient name: Peggy [name] MRN [id number]  Date of birth: 08/12/1986 Chief Complaint:   High Risk Gestation (c/o "cottage cheese" discharge)  History of Present Illness:   Peggy [name] is a 32 y.o. 73P2002 female at 157w0d with an Estimated Date of Delivery: 03/24/18 being seen today for ongoing management of a high-risk pregnancy complicated by chronic HTN. [facility]  There are notes/labs in her chart that refers to this being a mono/di pregnancy and a bicornuate uterus. Neither is accurate. Today she reports noticed some dishcarge when she voided just now. . Contractions: Not present. Vag. Bleeding: None.  Movement: Present. denies leaking of fluid.  Review of Systems:   Pertinent items are noted in HPI Denies abnormal vaginal discharge w/ itching/odor/irritation, headaches, visual changes, shortness of breath, chest pain, abdominal pain, severe nausea/vomiting, or problems with urination or bowel movements unless otherwise stated above.    Pertinent History Reviewed:  Medical & Surgical Hx:   Past Medical History:  Diagnosis Date  . Medical history non-contributory    Past Surgical History:  Procedure Laterality Date  . COLPOSCOPY     Family History  Problem Relation Age of Onset  . Heart disease Mother   . Hypertension Mother   . Asthma Daughter   . Asthma Son   . Diabetes Maternal Grandmother     Current Outpatient Medications:  .  Prenatal Vit-Fe Fumarate-FA (PREPLUS) 27-1 MG TABS, Take 1 tablet by mouth daily., Disp: 30 tablet, Rfl: 13 .  Vitamin D, Ergocalciferol, (DRISDOL) 50000 units CAPS capsule, Take 1 capsule (50,000 Units total) by mouth every 7 (seven) days., Disp: 30 capsule, Rfl: 2 .  metroNIDAZOLE (FLAGYL) 500 MG tablet, Take 1 tablet (500 mg total) by mouth 2 (two) times daily., Disp: 14 tablet, Rfl: 0 Social History: Reviewed -  reports that she has been smoking cigarettes.  She has  been smoking about 0.25 packs per day. She has never used smokeless tobacco.   Physical Assessment:   Vitals:   11/25/17 1532  BP: 130/84  Pulse: 92  Weight: 98.4 kg (217 lb)  Body mass index is 34.5 kg/m.           Physical Examination:   General appearance: alert, well appearing, and in no distress  Mental status: alert, oriented to person, place, and time  Skin: warm & dry   Extremities: Edema: None    Cardiovascular: normal heart rate noted  Respiratory: normal respiratory effort, no distress  Abdomen: gravid, soft, non-tender  Pelvic: Cervical exam deferred SSE:  White frothy dc w/amine odor.  Wet prep + clue, no trich or WBC        Fetal Status:     Movement: Present    Fetal Surveillance Testing today: doppler  No results found for this or any previous visit (from the past 24 hour(s)).  Assessment & Plan:  1) High-risk pregnancy G3P2002 at 337w0d with an Estimated Date of Delivery: 03/24/18   2) CHTN, no meds, , stable  3) BV,   Labs/procedures today:   Medications: Flagyl 500mg  BID  Treatment Plan:  Growth US 28-34-38, IOL 40; twice weekly testing >32 weeks  Follow-up: Return in about 3 weeks (around 12/16/2017) for HROB`.WIll do PN2/growth US/recheck OFT at 28 weeks. Pt does not want to have any more US done in [place].   Orders Placed This Encounter  Procedures  . POCT urinalysis dipstick   Jacklyn ShellFrances Cresenzo-Dishmon CNM 11/26/2017  11:53 PM

## 2017-12-03 ENCOUNTER — Ambulatory Visit (HOSPITAL_COMMUNITY)
Admission: RE | Admit: 2017-12-03 | Discharge: 2017-12-03 | Disposition: A | Discharge status: Home / Self Care | Payer: Medicaid Other | Source: Ambulatory Visit | Attending: Certified Nurse Midwife | Admitting: Certified Nurse Midwife

## 2017-12-16 ENCOUNTER — Ambulatory Visit (INDEPENDENT_AMBULATORY_CARE_PROVIDER_SITE_OTHER): Payer: Medicaid Other | Admitting: Women's Health

## 2017-12-16 ENCOUNTER — Other Ambulatory Visit: Payer: Self-pay

## 2017-12-16 ENCOUNTER — Encounter: Payer: Self-pay | Admitting: Women's Health

## 2017-12-16 VITALS — BP 122/74 | HR 98 | Wt 214.0 lb

## 2017-12-16 DIAGNOSIS — Z3A26 26 weeks gestation of pregnancy: Secondary | ICD-10-CM

## 2017-12-16 DIAGNOSIS — O10919 Unspecified pre-existing hypertension complicating pregnancy, unspecified trimester: Secondary | ICD-10-CM

## 2017-12-16 DIAGNOSIS — Z331 Pregnant state, incidental: Secondary | ICD-10-CM

## 2017-12-16 DIAGNOSIS — Z1389 Encounter for screening for other disorder: Secondary | ICD-10-CM

## 2017-12-16 DIAGNOSIS — O10912 Unspecified pre-existing hypertension complicating pregnancy, second trimester: Secondary | ICD-10-CM

## 2017-12-16 DIAGNOSIS — O0992 Supervision of high risk pregnancy, unspecified, second trimester: Secondary | ICD-10-CM

## 2017-12-16 DIAGNOSIS — Z362 Encounter for other antenatal screening follow-up: Secondary | ICD-10-CM

## 2017-12-16 LAB — POCT URINALYSIS DIPSTICK
Glucose, UA: NEGATIVE
Ketones, UA: NEGATIVE
LEUKOCYTES UA: NEGATIVE
Nitrite, UA: NEGATIVE
RBC UA: NEGATIVE

## 2017-12-16 NOTE — Progress Notes (Signed)
   HIGH-RISK PREGNANCY VISIT Patient name: Peggy [name] MRN [id number]  Date of birth: 1986/06/17 Chief Complaint:   High Risk Gestation  History of Present Illness:   Peggy [name] is a 32 y.o. G55P2002 female at [redacted]w[redacted]d with an Estimated Date of Delivery: 03/24/18 being seen today for ongoing management of a high-risk pregnancy complicated by [facility]- no meds.  Today she reports no complaints. Contractions: Not present. Vag. Bleeding: None.  Movement: Present. denies leaking of fluid.  Review of Systems:   Pertinent items are noted in HPI Denies abnormal vaginal discharge w/ itching/odor/irritation, headaches, visual changes, shortness of breath, chest pain, abdominal pain, severe nausea/vomiting, or problems with urination or bowel movements unless otherwise stated above. Pertinent History Reviewed:  Reviewed past medical,surgical, social, obstetrical and family history.  Reviewed problem list, medications and allergies. Physical Assessment:   Vitals:   12/16/17 1457  BP: 122/74  Pulse: 98  Weight: 214 lb (97.1 kg)  Body mass index is 34.02 kg/m.           Physical Examination:   General appearance: alert, well appearing, and in no distress  Mental status: alert, oriented to person, place, and time  Skin: warm & dry   Extremities: Edema: None    Cardiovascular: normal heart rate noted  Respiratory: normal respiratory effort, no distress  Abdomen: gravid, soft, non-tender  Pelvic: Cervical exam deferred         Fetal Status: Fetal Heart Rate (bpm): 155 Fundal Height: 28 cm Movement: Present    Fetal Surveillance Testing today: doppler   Results for orders placed or performed in visit on 12/16/17 (from the past 24 hour(s))  POCT urinalysis dipstick   Collection Time: 12/16/17  2:58 PM  Result Value Ref Range   Color, UA     Clarity, UA     Glucose, UA neg    Bilirubin, UA     Ketones, UA neg    Spec Grav, UA  1.010 - 1.025   Blood, UA neg    pH, UA  5.0 - 8.0   Protein,  UA trace    Urobilinogen, UA  0.2 or 1.0 E.U./dL   Nitrite, UA neg    Leukocytes, UA Negative Negative   Appearance     Odor      Assessment & Plan:  1) High-risk pregnancy G3P2002 at [redacted]w[redacted]d with an Estimated Date of Delivery: 03/24/18   2) CHTN- no meds, stable, continue baby ASA   Meds: No orders of the defined types were placed in this encounter.   Labs/procedures today: none  Treatment Plan:  Growth u/s @ 28, 34, 38wks     2x/wk testing nst/sono @ 32wks or weekly BPP   Deliver @ 40wks (no meds), 39wks (meds)  Reviewed: Preterm labor symptoms and general obstetric precautions including but not limited to vaginal bleeding, contractions, leaking of fluid and fetal movement were reviewed in detail with the patient.  All questions were answered.  Follow-up: Return in about 2 weeks (around 12/30/2017) for PN2, HROB, US:OB F/U heart.  Orders Placed This Encounter  Procedures  . US OB Follow Up  . POCT urinalysis dipstick   Cheral Marker CNM, [facility] 12/16/2017 3:15 PM

## 2017-12-16 NOTE — Patient Instructions (Signed)
[name], I greatly value your feedback.  If you receive a survey following your visit with [place] today, we appreciate you taking the time to fill it out.  Thanks, [name], CNM, WHNP-BC   Call the office [phone]) or go to [facility] if:  You begin to have strong, frequent contractions  Your water breaks.  Sometimes it is a big gush of fluid, sometimes it is just a trickle that keeps getting your panties wet or running down your legs  You have vaginal bleeding.  It is normal to have a small amount of spotting if your cervix was checked.   You don't feel your baby moving like normal.  If you don't, get you something to eat and drink and lay down and focus on feeling your baby move.  You should feel at least 10 movements in 2 hours.  If you don't, you should call the office or go to [facility].    Tdap Vaccine  It is recommended that you get the Tdap vaccine during the third trimester of EACH pregnancy to help protect your baby from getting pertussis (whooping cough)  27-36 weeks is the BEST time to do this so that you can pass the protection on to your baby. During pregnancy is better than after pregnancy, but if you are unable to get it during pregnancy it will be offered at the hospital.   You can get this vaccine at the health department or your family doctor  Everyone who will be around your baby should also be up-to-date on their vaccines. Adults (who are not pregnant) only need 1 dose of Tdap during adulthood.   Third Trimester of Pregnancy The third trimester is from week 29 through week 42, months 7 through 9. The third trimester is a time when the fetus is growing rapidly. At the end of the ninth month, the fetus is about 20 inches in length and weighs 6-10 pounds.  BODY CHANGES Your body goes through many changes during pregnancy. The changes vary from woman to woman.   Your weight will continue to increase. You can expect to gain 25-35 pounds (11-16 kg) by  the end of the pregnancy.  You may begin to get stretch marks on your hips, abdomen, and breasts.  You may urinate more often because the fetus is moving lower into your pelvis and pressing on your bladder.  You may develop or continue to have heartburn as a result of your pregnancy.  You may develop constipation because certain hormones are causing the muscles that push waste through your intestines to slow down.  You may develop hemorrhoids or swollen, bulging veins (varicose veins).  You may have pelvic pain because of the weight gain and pregnancy hormones relaxing your joints between the bones in your pelvis. Backaches may result from overexertion of the muscles supporting your posture.  You may have changes in your hair. These can include thickening of your hair, rapid growth, and changes in texture. Some women also have hair loss during or after pregnancy, or hair that feels dry or thin. Your hair will most likely return to normal after your baby is born.  Your breasts will continue to grow and be tender. A yellow discharge may leak from your breasts called colostrum.  Your belly button may stick out.  You may feel short of breath because of your expanding uterus.  You may notice the fetus "dropping," or moving lower in your abdomen.  You may have a bloody  mucus discharge. This usually occurs a few days to a week before labor begins.  Your cervix becomes thin and soft (effaced) near your due date. WHAT TO EXPECT AT YOUR PRENATAL EXAMS  You will have prenatal exams every 2 weeks until week 36. Then, you will have weekly prenatal exams. During a routine prenatal visit:  You will be weighed to make sure you and the fetus are growing normally.  Your blood pressure is taken.  Your abdomen will be measured to track your baby's growth.  The fetal heartbeat will be listened to.  Any test results from the previous visit will be discussed.  You may have a cervical check near your  due date to see if you have effaced. At around 36 weeks, your caregiver will check your cervix. At the same time, your caregiver will also perform a test on the secretions of the vaginal tissue. This test is to determine if a type of bacteria, Group B streptococcus, is present. Your caregiver will explain this further. Your caregiver may ask you:  What your birth plan is.  How you are feeling.  If you are feeling the baby move.  If you have had any abnormal symptoms, such as leaking fluid, bleeding, severe headaches, or abdominal cramping.  If you have any questions. Other tests or screenings that may be performed during your third trimester include:  Blood tests that check for low iron levels (anemia).  Fetal testing to check the health, activity level, and growth of the fetus. Testing is done if you have certain medical conditions or if there are problems during the pregnancy. FALSE LABOR You may feel small, irregular contractions that eventually go away. These are called Braxton Hicks contractions, or false labor. Contractions may last for hours, days, or even weeks before true labor sets in. If contractions come at regular intervals, intensify, or become painful, it is best to be seen by your caregiver.  SIGNS OF LABOR   Menstrual-like cramps.  Contractions that are 5 minutes apart or less.  Contractions that start on the top of the uterus and spread down to the lower abdomen and back.  A sense of increased pelvic pressure or back pain.  A watery or bloody mucus discharge that comes from the vagina. If you have any of these signs before the 37th week of pregnancy, call your caregiver right away. You need to go to the hospital to get checked immediately. HOME CARE INSTRUCTIONS   Avoid all smoking, herbs, alcohol, and unprescribed drugs. These chemicals affect the formation and growth of the baby.  Follow your caregiver's instructions regarding medicine use. There are medicines  that are either safe or unsafe to take during pregnancy.  Exercise only as directed by your caregiver. Experiencing uterine cramps is a good sign to stop exercising.  Continue to eat regular, healthy meals.  Wear a good support bra for breast tenderness.  Do not use hot tubs, steam rooms, or saunas.  Wear your seat belt at all times when driving.  Avoid raw meat, uncooked cheese, cat litter boxes, and soil used by cats. These carry germs that can cause birth defects in the baby.  Take your prenatal vitamins.  Try taking a stool softener (if your caregiver approves) if you develop constipation. Eat more high-fiber foods, such as fresh vegetables or fruit and whole grains. Drink plenty of fluids to keep your urine clear or pale yellow.  Take warm sitz baths to soothe any pain or discomfort caused by hemorrhoids.  Use hemorrhoid cream if your caregiver approves.  If you develop varicose veins, wear support hose. Elevate your feet for 15 minutes, 3-4 times a day. Limit salt in your diet.  Avoid heavy lifting, wear low heal shoes, and practice good posture.  Rest a lot with your legs elevated if you have leg cramps or low back pain.  Visit your dentist if you have not gone during your pregnancy. Use a soft toothbrush to brush your teeth and be gentle when you floss.  A sexual relationship may be continued unless your caregiver directs you otherwise.  Do not travel far distances unless it is absolutely necessary and only with the approval of your caregiver.  Take prenatal classes to understand, practice, and ask questions about the labor and delivery.  Make a trial run to the hospital.  Pack your hospital bag.  Prepare the baby's nursery.  Continue to go to all your prenatal visits as directed by your caregiver. SEEK MEDICAL CARE IF:  You are unsure if you are in labor or if your water has broken.  You have dizziness.  You have mild pelvic cramps, pelvic pressure, or nagging  pain in your abdominal area.  You have persistent nausea, vomiting, or diarrhea.  You have a bad smelling vaginal discharge.  You have pain with urination. SEEK IMMEDIATE MEDICAL CARE IF:   You have a fever.  You are leaking fluid from your vagina.  You have spotting or bleeding from your vagina.  You have severe abdominal cramping or pain.  You have rapid weight loss or gain.  You have shortness of breath with chest pain.  You notice sudden or extreme swelling of your face, hands, ankles, feet, or legs.  You have not felt your baby move in over an hour.  You have severe headaches that do not go away with medicine.  You have vision changes. Document Released: 07/28/2001 Document Revised: 08/08/2013 Document Reviewed: 10/04/2012 Exit[employer] Patient Information 2015 [employer]. This information is not intended to replace advice given to you by your health care provider. Make sure you discuss any questions you have with your health care provider.   

## 2017-12-30 ENCOUNTER — Other Ambulatory Visit: Payer: Medicaid Other

## 2017-12-31 ENCOUNTER — Encounter: Payer: Medicaid Other | Admitting: Women's Health

## 2017-12-31 ENCOUNTER — Other Ambulatory Visit: Payer: Medicaid Other

## 2018-01-04 ENCOUNTER — Ambulatory Visit (INDEPENDENT_AMBULATORY_CARE_PROVIDER_SITE_OTHER): Payer: Medicaid Other

## 2018-01-04 ENCOUNTER — Other Ambulatory Visit: Payer: Self-pay | Admitting: Women's Health

## 2018-01-04 DIAGNOSIS — O0992 Supervision of high risk pregnancy, unspecified, second trimester: Secondary | ICD-10-CM

## 2018-01-04 DIAGNOSIS — Z363 Encounter for antenatal screening for malformations: Secondary | ICD-10-CM

## 2018-01-04 DIAGNOSIS — Z362 Encounter for other antenatal screening follow-up: Secondary | ICD-10-CM

## 2018-01-04 NOTE — Progress Notes (Signed)
[place] 28+5 wks,cephalic,posterior pl gr 0,cx 3.1 cm,normal ovaries bilat,afi 12 cm,fhr 159 bpm,efw 1307 g 45%,anatomy complete,no obvious abnormalities

## 2018-01-13 ENCOUNTER — Ambulatory Visit (INDEPENDENT_AMBULATORY_CARE_PROVIDER_SITE_OTHER): Payer: Medicaid Other | Admitting: Women's Health

## 2018-01-13 ENCOUNTER — Other Ambulatory Visit: Payer: Medicaid Other

## 2018-01-13 ENCOUNTER — Encounter: Payer: Self-pay | Admitting: Women's Health

## 2018-01-13 VITALS — BP 130/80 | HR 90 | Wt 216.0 lb

## 2018-01-13 DIAGNOSIS — O99613 Diseases of the digestive system complicating pregnancy, third trimester: Secondary | ICD-10-CM

## 2018-01-13 DIAGNOSIS — Z1389 Encounter for screening for other disorder: Secondary | ICD-10-CM

## 2018-01-13 DIAGNOSIS — Z3A30 30 weeks gestation of pregnancy: Secondary | ICD-10-CM

## 2018-01-13 DIAGNOSIS — R12 Heartburn: Secondary | ICD-10-CM

## 2018-01-13 DIAGNOSIS — Z331 Pregnant state, incidental: Secondary | ICD-10-CM

## 2018-01-13 DIAGNOSIS — Z3483 Encounter for supervision of other normal pregnancy, third trimester: Secondary | ICD-10-CM

## 2018-01-13 LAB — POCT URINALYSIS DIPSTICK
Blood, UA: NEGATIVE
GLUCOSE UA: NEGATIVE
Leukocytes, UA: NEGATIVE
NITRITE UA: NEGATIVE
PROTEIN UA: POSITIVE — AB

## 2018-01-13 MED ORDER — PANTOPRAZOLE SODIUM 20 MG PO TBEC: 20.0000 mg | DELAYED_RELEASE_TABLET | Freq: Every day | ORAL | 6 refills | Status: DC

## 2018-01-13 NOTE — Progress Notes (Signed)
LOW-RISK PREGNANCY VISIT Patient name: Peggy [name] MRN [id number]  Date of birth: February 26, 1986 Chief Complaint:   Routine Prenatal Visit  History of Present Illness:   Peggy [name] is a 32 y.o. G25P2002 female at [redacted]w[redacted]d with an Estimated Date of Delivery: 03/24/18 being seen today for ongoing management of a low-risk pregnancy. Previously had dx of CHTN, upon asking pt, she has never been on meds, states she was dx at beginning of this pregnancy from 1 elevated bp, all subsequent bp's have been normal. No h/o HTN w/ other pregnancies or b/w pregnancies. Will remove dx of CHTN.  Today she reports bad heartburn, TUMS not helping. Contractions: Not present. Vag. Bleeding: None.  Movement: Present. denies leaking of fluid. Review of Systems:   Pertinent items are noted in HPI Denies abnormal vaginal discharge w/ itching/odor/irritation, headaches, visual changes, shortness of breath, chest pain, abdominal pain, severe nausea/vomiting, or problems with urination or bowel movements unless otherwise stated above. Pertinent History Reviewed:  Reviewed past medical,surgical, social, obstetrical and family history.  Reviewed problem list, medications and allergies. Physical Assessment:   Vitals:   01/13/18 0914  BP: 130/80  Pulse: 90  Weight: 216 lb (98 kg)  Body mass index is 34.34 kg/m.        Physical Examination:   General appearance: Well appearing, and in no distress  Mental status: Alert, oriented to person, place, and time  Skin: Warm & dry  Cardiovascular: Normal heart rate noted  Respiratory: Normal respiratory effort, no distress  Abdomen: Soft, gravid, nontender  Pelvic: Cervical exam deferred         Extremities: Edema: None  Fetal Status: Fetal Heart Rate (bpm): 147 Fundal Height: 30 cm Movement: Present    Results for orders placed or performed in visit on 01/13/18 (from the past 24 hour(s))  POCT Urinalysis Dipstick   Collection Time: 01/13/18  9:15 AM  Result Value  Ref Range   Color, UA     Clarity, UA     Glucose, UA Negative Negative   Bilirubin, UA     Ketones, UA large    Spec Grav, UA  1.010 - 1.025   Blood, UA neg    pH, UA  5.0 - 8.0   Protein, UA Positive (A) Negative   Urobilinogen, UA  0.2 or 1.0 E.U./dL   Nitrite, UA neg    Leukocytes, UA Negative Negative   Appearance     Odor      Assessment & Plan:  1) Low-risk pregnancy G3P2002 at [redacted]w[redacted]d with an Estimated Date of Delivery: 03/24/18   2) Heartburn, rx protonix   Meds:  Meds ordered this encounter  Medications  . pantoprazole (PROTONIX) 20 MG tablet    Sig: Take 1 tablet (20 mg total) by mouth daily.    Dispense:  30 tablet    Refill:  6    Order Specific Question:   Supervising Provider    Answer:   Lazaro Arms [[id number]]   Labs/procedures today: pn2, declines tdap today, wants next visit  Plan:  Continue routine obstetrical care   Reviewed: Preterm labor symptoms and general obstetric precautions including but not limited to vaginal bleeding, contractions, leaking of fluid and fetal movement were reviewed in detail with the patient.  Recommended Tdap at HD/PCP per CDC guidelines. All questions were answered  Follow-up: Return in about 2 weeks (around 01/27/2018) for LROB.  Orders Placed This Encounter  Procedures  . POCT Urinalysis Dipstick   Merlene Laughter  Evalina Tabak CNM, WHNP-BC 01/13/2018 10:01 AM

## 2018-01-13 NOTE — Patient Instructions (Signed)
Peggy [name], I greatly value your feedback.  If you receive a survey following your visit with [place] today, we appreciate you taking the time to fill it out.  Thanks, [name], CNM, WHNP-BC   Call the office [phone]) or go to [facility] if:  You begin to have strong, frequent contractions  Your water breaks.  Sometimes it is a big gush of fluid, sometimes it is just a trickle that keeps getting your panties wet or running down your legs  You have vaginal bleeding.  It is normal to have a small amount of spotting if your cervix was checked.   You don't feel your baby moving like normal.  If you don't, get you something to eat and drink and lay down and focus on feeling your baby move.  You should feel at least 10 movements in 2 hours.  If you don't, you should call the office or go to [facility].    Tdap Vaccine  It is recommended that you get the Tdap vaccine during the third trimester of EACH pregnancy to help protect your baby from getting pertussis (whooping cough)  27-36 weeks is the BEST time to do this so that you can pass the protection on to your baby. During pregnancy is better than after pregnancy, but if you are unable to get it during pregnancy it will be offered at the hospital.   You can get this vaccine at the health department or your family doctor  Everyone who will be around your baby should also be up-to-date on their vaccines. Adults (who are not pregnant) only need 1 dose of Tdap during adulthood.   Third Trimester of Pregnancy The third trimester is from week 29 through week 42, months 7 through 9. The third trimester is a time when the fetus is growing rapidly. At the end of the ninth month, the fetus is about 20 inches in length and weighs 6-10 pounds.  BODY CHANGES Your body goes through many changes during pregnancy. The changes vary from woman to woman.   Your weight will continue to increase. You can expect to gain 25-35 pounds (11-16 kg) by  the end of the pregnancy.  You may begin to get stretch marks on your hips, abdomen, and breasts.  You may urinate more often because the fetus is moving lower into your pelvis and pressing on your bladder.  You may develop or continue to have heartburn as a result of your pregnancy.  You may develop constipation because certain hormones are causing the muscles that push waste through your intestines to slow down.  You may develop hemorrhoids or swollen, bulging veins (varicose veins).  You may have pelvic pain because of the weight gain and pregnancy hormones relaxing your joints between the bones in your pelvis. Backaches may result from overexertion of the muscles supporting your posture.  You may have changes in your hair. These can include thickening of your hair, rapid growth, and changes in texture. Some women also have hair loss during or after pregnancy, or hair that feels dry or thin. Your hair will most likely return to normal after your baby is born.  Your breasts will continue to grow and be tender. A yellow discharge may leak from your breasts called colostrum.  Your belly button may stick out.  You may feel short of breath because of your expanding uterus.  You may notice the fetus "dropping," or moving lower in your abdomen.  You may have a bloody  mucus discharge. This usually occurs a few days to a week before labor begins.  Your cervix becomes thin and soft (effaced) near your due date. WHAT TO EXPECT AT YOUR PRENATAL EXAMS  You will have prenatal exams every 2 weeks until week 36. Then, you will have weekly prenatal exams. During a routine prenatal visit:  You will be weighed to make sure you and the fetus are growing normally.  Your blood pressure is taken.  Your abdomen will be measured to track your baby's growth.  The fetal heartbeat will be listened to.  Any test results from the previous visit will be discussed.  You may have a cervical check near your  due date to see if you have effaced. At around 36 weeks, your caregiver will check your cervix. At the same time, your caregiver will also perform a test on the secretions of the vaginal tissue. This test is to determine if a type of bacteria, Group B streptococcus, is present. Your caregiver will explain this further. Your caregiver may ask you:  What your birth plan is.  How you are feeling.  If you are feeling the baby move.  If you have had any abnormal symptoms, such as leaking fluid, bleeding, severe headaches, or abdominal cramping.  If you have any questions. Other tests or screenings that may be performed during your third trimester include:  Blood tests that check for low iron levels (anemia).  Fetal testing to check the health, activity level, and growth of the fetus. Testing is done if you have certain medical conditions or if there are problems during the pregnancy. FALSE LABOR You may feel small, irregular contractions that eventually go away. These are called Braxton Hicks contractions, or false labor. Contractions may last for hours, days, or even weeks before true labor sets in. If contractions come at regular intervals, intensify, or become painful, it is best to be seen by your caregiver.  SIGNS OF LABOR   Menstrual-like cramps.  Contractions that are 5 minutes apart or less.  Contractions that start on the top of the uterus and spread down to the lower abdomen and back.  A sense of increased pelvic pressure or back pain.  A watery or bloody mucus discharge that comes from the vagina. If you have any of these signs before the 37th week of pregnancy, call your caregiver right away. You need to go to the hospital to get checked immediately. HOME CARE INSTRUCTIONS   Avoid all smoking, herbs, alcohol, and unprescribed drugs. These chemicals affect the formation and growth of the baby.  Follow your caregiver's instructions regarding medicine use. There are medicines  that are either safe or unsafe to take during pregnancy.  Exercise only as directed by your caregiver. Experiencing uterine cramps is a good sign to stop exercising.  Continue to eat regular, healthy meals.  Wear a good support bra for breast tenderness.  Do not use hot tubs, steam rooms, or saunas.  Wear your seat belt at all times when driving.  Avoid raw meat, uncooked cheese, cat litter boxes, and soil used by cats. These carry germs that can cause birth defects in the baby.  Take your prenatal vitamins.  Try taking a stool softener (if your caregiver approves) if you develop constipation. Eat more high-fiber foods, such as fresh vegetables or fruit and whole grains. Drink plenty of fluids to keep your urine clear or pale yellow.  Take warm sitz baths to soothe any pain or discomfort caused by hemorrhoids.  Use hemorrhoid cream if your caregiver approves.  If you develop varicose veins, wear support hose. Elevate your feet for 15 minutes, 3-4 times a day. Limit salt in your diet.  Avoid heavy lifting, wear low heal shoes, and practice good posture.  Rest a lot with your legs elevated if you have leg cramps or low back pain.  Visit your dentist if you have not gone during your pregnancy. Use a soft toothbrush to brush your teeth and be gentle when you floss.  A sexual relationship may be continued unless your caregiver directs you otherwise.  Do not travel far distances unless it is absolutely necessary and only with the approval of your caregiver.  Take prenatal classes to understand, practice, and ask questions about the labor and delivery.  Make a trial run to the hospital.  Pack your hospital bag.  Prepare the baby's nursery.  Continue to go to all your prenatal visits as directed by your caregiver. SEEK MEDICAL CARE IF:  You are unsure if you are in labor or if your water has broken.  You have dizziness.  You have mild pelvic cramps, pelvic pressure, or nagging  pain in your abdominal area.  You have persistent nausea, vomiting, or diarrhea.  You have a bad smelling vaginal discharge.  You have pain with urination. SEEK IMMEDIATE MEDICAL CARE IF:   You have a fever.  You are leaking fluid from your vagina.  You have spotting or bleeding from your vagina.  You have severe abdominal cramping or pain.  You have rapid weight loss or gain.  You have shortness of breath with chest pain.  You notice sudden or extreme swelling of your face, hands, ankles, feet, or legs.  You have not felt your baby move in over an hour.  You have severe headaches that do not go away with medicine.  You have vision changes. Document Released: 07/28/2001 Document Revised: 08/08/2013 Document Reviewed: 10/04/2012 [employer] Patient Information 2015 [employer]. This information is not intended to replace advice given to you by your health care provider. Make sure you discuss any questions you have with your health care provider.   

## 2018-01-14 ENCOUNTER — Other Ambulatory Visit: Payer: Self-pay | Admitting: Women's Health

## 2018-01-14 ENCOUNTER — Encounter: Payer: Self-pay | Admitting: Women's Health

## 2018-01-14 DIAGNOSIS — Z8632 Personal history of gestational diabetes: Secondary | ICD-10-CM | POA: Insufficient documentation

## 2018-01-14 DIAGNOSIS — O24410 Gestational diabetes mellitus in pregnancy, diet controlled: Secondary | ICD-10-CM

## 2018-01-14 LAB — CBC
Hematocrit: 37.2 % (ref 34.0–46.6)
Hemoglobin: 12.7 g/dL (ref 11.1–15.9)
MCH: 28.6 pg (ref 26.6–33.0)
MCHC: 34.1 g/dL (ref 31.5–35.7)
MCV: 84 fL (ref 79–97)
Platelets: 211 10*3/uL (ref 150–450)
RBC: 4.44 x10E6/uL (ref 3.77–5.28)
RDW: 13.5 % (ref 12.3–15.4)
WBC: 10 10*3/uL (ref 3.4–10.8)

## 2018-01-14 LAB — ANTIBODY SCREEN: Antibody Screen: NEGATIVE

## 2018-01-14 LAB — GLUCOSE TOLERANCE, 2 HOURS W/ 1HR
GLUCOSE, FASTING: 97 mg/dL — AB (ref 65–91)
Glucose, 1 hour: 176 mg/dL (ref 65–179)
Glucose, 2 hour: 160 mg/dL — ABNORMAL HIGH (ref 65–152)

## 2018-01-14 LAB — HIV ANTIBODY (ROUTINE TESTING W REFLEX): HIV Screen 4th Generation wRfx: NONREACTIVE

## 2018-01-14 LAB — RPR: RPR Ser Ql: NONREACTIVE

## 2018-01-24 ENCOUNTER — Telehealth: Payer: Self-pay | Admitting: Advanced Practice Midwife

## 2018-01-24 NOTE — Telephone Encounter (Signed)
Glucose meter, lancets and strips called to pharmacy.   

## 2018-01-24 NOTE — Telephone Encounter (Signed)
Has appt with dietcian  for the 12th and she is suppose to bring her meter    with her and she does not have one do we need to call one in for her

## 2018-01-26 ENCOUNTER — Ambulatory Visit: Payer: Medicaid Other | Admitting: Registered"

## 2018-01-27 ENCOUNTER — Ambulatory Visit (INDEPENDENT_AMBULATORY_CARE_PROVIDER_SITE_OTHER): Payer: Medicaid Other | Admitting: Women's Health

## 2018-01-27 ENCOUNTER — Other Ambulatory Visit: Payer: Self-pay

## 2018-01-27 ENCOUNTER — Encounter: Payer: Self-pay | Admitting: Women's Health

## 2018-01-27 VITALS — BP 126/80 | HR 113 | Wt 213.8 lb

## 2018-01-27 DIAGNOSIS — Z3A32 32 weeks gestation of pregnancy: Secondary | ICD-10-CM | POA: Diagnosis not present

## 2018-01-27 DIAGNOSIS — O24410 Gestational diabetes mellitus in pregnancy, diet controlled: Secondary | ICD-10-CM

## 2018-01-27 DIAGNOSIS — Z1389 Encounter for screening for other disorder: Secondary | ICD-10-CM

## 2018-01-27 DIAGNOSIS — O0990 Supervision of high risk pregnancy, unspecified, unspecified trimester: Secondary | ICD-10-CM

## 2018-01-27 DIAGNOSIS — K5900 Constipation, unspecified: Secondary | ICD-10-CM

## 2018-01-27 DIAGNOSIS — O99613 Diseases of the digestive system complicating pregnancy, third trimester: Secondary | ICD-10-CM

## 2018-01-27 DIAGNOSIS — Z23 Encounter for immunization: Secondary | ICD-10-CM

## 2018-01-27 DIAGNOSIS — O0993 Supervision of high risk pregnancy, unspecified, third trimester: Secondary | ICD-10-CM

## 2018-01-27 DIAGNOSIS — Z331 Pregnant state, incidental: Secondary | ICD-10-CM

## 2018-01-27 LAB — POCT URINALYSIS DIPSTICK
Blood, UA: NEGATIVE
GLUCOSE UA: NEGATIVE
LEUKOCYTES UA: NEGATIVE
Nitrite, UA: NEGATIVE
Protein, UA: POSITIVE — AB

## 2018-01-27 NOTE — Progress Notes (Signed)
   HIGH-RISK PREGNANCY VISIT Patient name: Peggy [name] MRN [id number]  Date of birth: 1986-04-28 Chief Complaint:   Routine Prenatal Visit (contractoins last night and this am)  History of Present Illness:   Peggy [name] is a 32 y.o. 303P2002 female at 9562w0d with an Estimated Date of Delivery: 03/24/18 being seen today for ongoing management of a high-risk pregnancy complicated by A1DM.  Today she reports missed dietician appt yesterday b/c she wasn't feeling well, was having uc's and went to hospital. Has checked one sugar at home. Is constipated, hasn't tried anything. Contractions: Irregular. Vag. Bleeding: None.  Movement: Present. denies leaking of fluid.  Review of Systems:   Pertinent items are noted in HPI Denies abnormal vaginal discharge w/ itching/odor/irritation, headaches, visual changes, shortness of breath, chest pain, abdominal pain, severe nausea/vomiting, or problems with urination or bowel movements unless otherwise stated above. Pertinent History Reviewed:  Reviewed past medical,surgical, social, obstetrical and family history.  Reviewed problem list, medications and allergies. Physical Assessment:   Vitals:   01/27/18 0952  BP: 126/80  Pulse: (!) 113  Weight: 213 lb 12.8 oz (97 kg)  Body mass index is 33.99 kg/m.           Physical Examination:   General appearance: alert, well appearing, and in no distress  Mental status: alert, oriented to person, place, and time  Skin: warm & dry   Extremities: Edema: None    Cardiovascular: normal heart rate noted  Respiratory: normal respiratory effort, no distress  Abdomen: gravid, soft, non-tender  Pelvic: Cervical exam deferred         Fetal Status: Fetal Heart Rate (bpm): 149 Fundal Height: 32 cm Movement: Present    Fetal Surveillance Testing today: doppler   No results found for this or any previous visit (from the past 24 hour(s)).  Assessment & Plan:  1) High-risk pregnancy G3P2002 at 3562w0d with an  Estimated Date of Delivery: 03/24/18   2) A1DM, not checking QID sugars, missed dietician appt yesterday. Promises to call today and reschedule. Reviewed cutting back carbs, increasing protein, checking sugars QID and goals, bring log to each appt.  3) Constipation, discussed and gave printed prevention/relief measures   Meds: No orders of the defined types were placed in this encounter.   Labs/procedures today: tdap  Treatment Plan:  Growth u/s @  36-38wks     Deliver @ 40wks   Reviewed: Preterm labor symptoms and general obstetric precautions including but not limited to vaginal bleeding, contractions, leaking of fluid and fetal movement were reviewed in detail with the patient.  All questions were answered.  Follow-up: Return in about 1 week (around 02/03/2018) for HROB.  Orders Placed This Encounter  Procedures  . Tdap vaccine greater than or equal to 7yo IM   Cheral MarkerKimberly R Kason Benak CNM, [facility]NP[facility] 01/27/2018 10:32 AM

## 2018-01-27 NOTE — Addendum Note (Signed)
Addended by: Moss McRESENZO, LATISHA M on: 01/27/2018 12:18 PM   Modules accepted: Orders

## 2018-01-27 NOTE — Patient Instructions (Signed)
Peggy [name], I greatly value your feedback.  If you receive a survey following your visit with [place] today, we appreciate you taking the time to fill it out.  Thanks, [name], CNM, WHNP-BC  Check blood sugars 4 times a day: in the morning before eating/drinking anything (<95) and 2 hours after eating breakfast, lunch, and supper (<120).     Call the office [phone]) or go to [facility] if:  You begin to have strong, frequent contractions  Your water breaks.  Sometimes it is a big gush of fluid, sometimes it is just a trickle that keeps getting your panties wet or running down your legs  You have vaginal bleeding.  It is normal to have a small amount of spotting if your cervix was checked.   You don't feel your baby moving like normal.  If you don't, get you something to eat and drink and lay down and focus on feeling your baby move.  You should feel at least 10 movements in 2 hours.  If you don't, you should call the office or go to [facility].    Constipation  Drink plenty of fluid, preferably water, throughout the day  Eat foods high in fiber such as fruits, vegetables, and grains  Exercise, such as walking, is a good way to keep your bowels regular  Drink warm fluids, especially warm prune juice, or decaf coffee  Eat a 1/2 cup of real oatmeal (not instant), 1/2 cup applesauce, and 1/2-1 cup warm prune juice every day  If needed, you may take Colace (docusate sodium) stool softener once or twice a day to help keep the stool soft. If you are pregnant, wait until you are out of your first trimester (12-14 weeks of pregnancy)  If you still are having problems with constipation, you may take Miralax once daily as needed to help keep your bowels regular.  If you are pregnant, wait until you are out of your first trimester (12-14 weeks of pregnancy)   Gestational Diabetes Mellitus, Diagnosis Gestational diabetes (gestational diabetes mellitus) is a temporary form of  diabetes that some women develop during pregnancy. It usually occurs around weeks 24-28 of pregnancy and goes away after delivery. Hormonal changes during pregnancy can interfere with insulin production and function, which may result in one or both of these problems:  The pancreas does not make enough of a hormone called insulin.  Cells in the body do not respond properly to insulin that the body makes (insulin resistance).  Normally, insulin allows sugars (glucose) to enter cells in the body. The cells use glucose for energy. Insulin resistance or lack of insulin causes excess glucose to build up in the blood instead of going into cells. As a result, high blood glucose (hyperglycemia) develops. What are the risks? If gestational diabetes is treated, it is unlikely to cause problems. If it is not controlled with treatment, it may cause problems during labor and delivery, and some of those problems can be harmful to the unborn baby (fetus) and the mother. Uncontrolled gestational diabetes may also cause the newborn baby to have breathing problems and low blood glucose. Women who get gestational diabetes are more likely to develop it if they get pregnant again, and they are more likely to develop type 2 diabetes in the future. What increases the risk? This condition may be more likely to develop in pregnant women who:  Are older than age 73 during pregnancy.  Have a family history of diabetes.  Are  overweight.  Had gestational diabetes in the past.  Have polycystic ovarian syndrome (PCOS).  Are pregnant with twins or multiples.  Are of American-Indian, African-American, Hispanic/Latino, or Asian/Pacific Islander descent.  What are the signs or symptoms? Most women do not notice symptoms of gestational diabetes because the symptoms are similar to normal symptoms of pregnancy. Symptoms of gestational diabetes may include:  Increased thirst (polydipsia).  Increased  hunger(polyphagia).  Increased urination (polyuria).  How is this diagnosed?  This condition may be diagnosed based on your blood glucose level, which may be checked with one or more of the following blood tests:  A fasting blood glucose (FBG) test. You will not be allowed to eat (you will fast) for at least 8 hours before a blood sample is taken.  A random blood glucose test. This checks your blood glucose at any time of day regardless of when you ate.  An oral glucose tolerance test (OGTT). This is usually done during weeks 24-28 of pregnancy. ? For this test, you will have an FBG test done. Then, you will drink a beverage that contains glucose. Your blood glucose will be tested again 1 hour after drinking the glucose beverage (1-hour OGTT). ? If the 1-hour OGTT result is at or above 140 mg/dL (7.8 mmol/L), you will repeat the OGTT. This time, your blood glucose will be tested 3 hours after drinking the glucose beverage (3-hour OGTT).  If you have risk factors, you may be screened for undiagnosed type 2 diabetes at your first health care visit during your pregnancy (prenatal visit). How is this treated?  Your treatment may be managed by a specialist called an endocrinologist. This condition is treated by following instructions from your health care provider about:  Eating a healthier diet and getting more physical activity. These changes are the most important ways to manage gestational diabetes.  Checking your blood glucose. Do this as often as told.  Taking diabetes medicines or insulin every day. These will only be prescribed if they are needed. ? If you use insulin, you may need to adjust your dosage based on how physically active you are and what foods you eat. Your health care provider will tell you how to do this.  Your health care provider will set treatment goals for you based on the stage of your pregnancy and any other medical conditions you have. Generally, the goal of  treatment is to maintain the following blood glucose levels during pregnancy:  Fasting: at or below 95 mg/dL (5.3 mmol/L).  After meals (postprandial): ? One hour after a meal: at or below 140 mg/dL (7.8 mmol/L). ? Two hours after a meal: at or below 120 mg/dL (6.7 mmol/L).  A1c (hemoglobin A1c) level: 6-6.5%.  Follow these instructions at home:  Take over-the-counter and prescription medicines only as told by your health care provider.  Manage your weight gain during pregnancy. The amount of weight that you are expected to gain depends on your pre-pregnancy BMI (body mass index).  Keep all follow-up visits as told by your health care provider. This is important. Consider asking your health care provider these questions:   Do I need to meet with a diabetes educator?  Where can I find a support group for people with diabetes?  What equipment will I need to manage my diabetes at home?  What diabetes medicines do I need, and when should I take them?  How often do I need to check my blood glucose?  What number can I call  if I have questions?  When is my next appointment? Where to find more information:  For more information about diabetes, visit: ? American Diabetes Association (ADA): www.diabetes.org ? American Association of Diabetes Educators (AADE): www.diabeteseducator.org/patient-resources Contact a health care provider if:  Your blood glucose level is at or above 240 mg/dL (16.113.3 mmol/L).  Your blood glucose level is at or above 200 mg/dL (09.611.1 mmol/L) and you have ketones in your urine.  You have been sick or have had a fever for 2 days or more and you are not getting better.  You have any of the following problems for more than 6 hours: ? You cannot eat or drink. ? You have nausea and vomiting. ? You have diarrhea. Get help right away if:  Your blood glucose is below 54 mg/dL (3 mmol/L).  You become confused or you have trouble thinking clearly.  You have  difficulty breathing.  You have moderate or large ketone levels in your urine.  Your baby is moving around less than usual.  You develop unusual discharge or bleeding from your vagina.  You start having contractions early (prematurely). Contractions may feel like a tightening in your lower abdomen. This information is not intended to replace advice given to you by your health care provider. Make sure you discuss any questions you have with your health care provider. Document Released: 11/09/2000 Document Revised: 01/09/2016 Document Reviewed: 09/06/2015 Elsevier Interactive Patient Education  [employer]2018 Elsevier Inc.

## 2018-02-03 ENCOUNTER — Telehealth: Payer: Self-pay | Admitting: *Deleted

## 2018-02-03 NOTE — Telephone Encounter (Signed)
Patient informed hiccups are normal during pregnancy and reassuring.  Patient states she "googled the hiccups" and it said there could be a problem.  Informed patient to get off google that that does not mean there is a problem with the cord. Pt verbalized understanding.

## 2018-02-04 ENCOUNTER — Encounter: Payer: Medicaid Other | Admitting: Obstetrics & Gynecology

## 2018-02-15 ENCOUNTER — Other Ambulatory Visit: Payer: Self-pay

## 2018-02-15 ENCOUNTER — Encounter: Payer: Self-pay | Admitting: Advanced Practice Midwife

## 2018-02-15 ENCOUNTER — Ambulatory Visit (INDEPENDENT_AMBULATORY_CARE_PROVIDER_SITE_OTHER): Payer: Medicaid Other | Admitting: Advanced Practice Midwife

## 2018-02-15 VITALS — BP 117/73 | HR 105 | Wt 220.0 lb

## 2018-02-15 DIAGNOSIS — O24410 Gestational diabetes mellitus in pregnancy, diet controlled: Secondary | ICD-10-CM

## 2018-02-15 DIAGNOSIS — Z3A34 34 weeks gestation of pregnancy: Secondary | ICD-10-CM

## 2018-02-15 DIAGNOSIS — O24419 Gestational diabetes mellitus in pregnancy, unspecified control: Secondary | ICD-10-CM

## 2018-02-15 DIAGNOSIS — Z331 Pregnant state, incidental: Secondary | ICD-10-CM

## 2018-02-15 DIAGNOSIS — O0993 Supervision of high risk pregnancy, unspecified, third trimester: Secondary | ICD-10-CM

## 2018-02-15 DIAGNOSIS — Z1389 Encounter for screening for other disorder: Secondary | ICD-10-CM

## 2018-02-15 LAB — POCT URINALYSIS DIPSTICK
GLUCOSE UA: NEGATIVE
KETONES UA: NEGATIVE
LEUKOCYTES UA: NEGATIVE
Nitrite, UA: NEGATIVE
Protein, UA: NEGATIVE
RBC UA: NEGATIVE

## 2018-02-15 NOTE — Patient Instructions (Signed)
Kinesiology taping for pregnancy:  Youtube has good vidoes of "how tos" for lower back, pelvic, hip pain; swelling of feet, etc   Check sugar:  Before you eat or drink anything in the morning (write down if you had a middle of the night snack and what that snack was and what time it was)  1 hour after meals   Write time of day that you check and the result

## 2018-02-15 NOTE — Progress Notes (Signed)
   LOW-RISK PREGNANCY VISIT Patient name: Peggy [name] MRN [id number]  Date of birth: [birth year] Chief Complaint:   High Risk Gestation  History of Present Illness:   Peggy [name] is a 32 y.o. 243P2002 female at 8761w5d with an Estimated Date of Delivery: 03/24/18 being seen today for ongoing management of a high-risk pregnancy. A1DM, Today she reports she isn't checking sugars  Discussed importance of checking them. Agrees to check more often. Contractions: Not present. Vag. Bleeding: None.  Movement: Present. denies leaking of fluid. Review of Systems:   Pertinent items are noted in HPI Denies abnormal vaginal discharge w/ itching/odor/irritation, headaches, visual changes, shortness of breath, chest pain, abdominal pain, severe nausea/vomiting, or problems with urination or bowel movements unless otherwise stated above.  Pertinent History Reviewed:  Medical & Surgical Hx:   Past Medical History:  Diagnosis Date  . Medical history non-contributory    Past Surgical History:  Procedure Laterality Date  . COLPOSCOPY     Family History  Problem Relation Age of Onset  . Heart disease Mother   . Hypertension Mother   . Asthma Daughter   . Asthma Son   . Diabetes Maternal Grandmother     Current Outpatient Medications:  .  pantoprazole (PROTONIX) 20 MG tablet, Take 1 tablet (20 mg total) by mouth daily., Disp: 30 tablet, Rfl: 6 .  Prenatal Vit-Fe Fumarate-FA (PREPLUS) 27-1 MG TABS, Take 1 tablet by mouth daily., Disp: 30 tablet, Rfl: 13 .  Vitamin D, Ergocalciferol, (DRISDOL) 50000 units CAPS capsule, Take 1 capsule (50,000 Units total) by mouth every 7 (seven) days. (Patient not taking: Reported on 01/27/2018), Disp: 30 capsule, Rfl: 2 Social History: Reviewed -  reports that she has been smoking cigarettes.  She has been smoking about 0.25 packs per day. She has never used smokeless tobacco.  Physical Assessment:   Vitals:   02/15/18 1617  BP: 117/73  Pulse: (!) 105  Weight: 220  lb (99.8 kg)  Body mass index is 34.98 kg/m.        Physical Examination:   General appearance: Well appearing, and in no distress  Mental status: Alert, oriented to person, place, and time  Skin: Warm & dry  Cardiovascular: Normal heart rate noted  Respiratory: Normal respiratory effort, no distress  Abdomen: Soft, gravid, nontender  Pelvic: Cervical exam deferred         Extremities: Edema: None  Fetal Status: Fetal Heart Rate (bpm): 150 Fundal Height: 34 cm Movement: Present    No results found for this or any previous visit (from the past 24 hour(s)).  Assessment & Plan:  1) Low-risk pregnancy G3P2002 at 2161w5d with an Estimated Date of Delivery: 03/24/18   2) A1dm, unsure of BS control, not checking   Labs/procedures/US today: none  Plan: EFW 37-ish weeks; agrees to check FBS/1 hour after meals   Follow-up: Return in about 1 week (around 02/22/2018) for HROB.  Orders Placed This Encounter  Procedures  . POCT urinalysis dipstick   Jacklyn ShellFrances Cresenzo-Dishmon CNM 02/17/2018 9:13 PM

## 2018-02-23 ENCOUNTER — Ambulatory Visit (INDEPENDENT_AMBULATORY_CARE_PROVIDER_SITE_OTHER): Payer: Medicaid Other | Admitting: Advanced Practice Midwife

## 2018-02-23 VITALS — BP 134/80 | HR 118 | Wt 219.5 lb

## 2018-02-23 DIAGNOSIS — Z3A35 35 weeks gestation of pregnancy: Secondary | ICD-10-CM

## 2018-02-23 DIAGNOSIS — O0990 Supervision of high risk pregnancy, unspecified, unspecified trimester: Secondary | ICD-10-CM | POA: Diagnosis not present

## 2018-02-23 DIAGNOSIS — O24410 Gestational diabetes mellitus in pregnancy, diet controlled: Secondary | ICD-10-CM | POA: Diagnosis not present

## 2018-02-23 DIAGNOSIS — Z1389 Encounter for screening for other disorder: Secondary | ICD-10-CM

## 2018-02-23 DIAGNOSIS — R800 Isolated proteinuria: Secondary | ICD-10-CM | POA: Diagnosis not present

## 2018-02-23 DIAGNOSIS — Z331 Pregnant state, incidental: Secondary | ICD-10-CM

## 2018-02-23 LAB — POCT URINALYSIS DIPSTICK
GLUCOSE UA: NEGATIVE
KETONES UA: NEGATIVE
Leukocytes, UA: NEGATIVE
Nitrite, UA: NEGATIVE
PROTEIN UA: POSITIVE — AB
RBC UA: NEGATIVE

## 2018-02-23 NOTE — Progress Notes (Signed)
HIGH-RISK PREGNANCY VISIT Patient name: Peggy [name] Nauert MRN [id number]  Date of birth: 03/22/1986 Chief Complaint:   Routine Prenatal Visit  History of Present Illness:   Peggy [name] Symanski is a 32 y.o. 573P2002 female at 419w6d with an Estimated Date of Delivery: 03/24/18 being seen today for ongoing management of a high-risk pregnancy complicated by gestational DM, class  A1 DM.  FBS <95, all but 3 pp<140 (177 highest after Kool-Aid).  Better at checking: checked 4 days in a row, none in past 3 days. Encouraged to keep checking. Denies UTI sx Today she reports no complaints. Contractions: Not present. Vag. Bleeding: None.  Movement: Present. denies leaking of fluid.  Review of Systems:   Pertinent items are noted in HPI Denies abnormal vaginal discharge w/ itching/odor/irritation, headaches, visual changes, shortness of breath, chest pain, abdominal pain, severe nausea/vomiting, or problems with urination or bowel movements unless otherwise stated above.    Pertinent History Reviewed:  Medical & Surgical Hx:   Past Medical History:  Diagnosis Date  . Medical history non-contributory    Past Surgical History:  Procedure Laterality Date  . COLPOSCOPY     Family History  Problem Relation Age of Onset  . Heart disease Mother   . Hypertension Mother   . Asthma Daughter   . Asthma Son   . Diabetes Maternal Grandmother     Current Outpatient Medications:  .  pantoprazole (PROTONIX) 20 MG tablet, Take 1 tablet (20 mg total) by mouth daily., Disp: 30 tablet, Rfl: 6 .  Prenatal Vit-Fe Fumarate-FA (PREPLUS) 27-1 MG TABS, Take 1 tablet by mouth daily., Disp: 30 tablet, Rfl: 13 .  Vitamin D, Ergocalciferol, (DRISDOL) 50000 units CAPS capsule, Take 1 capsule (50,000 Units total) by mouth every 7 (seven) days. (Patient not taking: Reported on 01/27/2018), Disp: 30 capsule, Rfl: 2 Social History: Reviewed -  reports that she has been smoking cigarettes.  She has been smoking about 0.25 packs per day. She  has never used smokeless tobacco.   Physical Assessment:   Vitals:   02/23/18 1442  BP: 134/80  Pulse: (!) 118  Weight: 219 lb 8 oz (99.6 kg)  Body mass index is 34.9 kg/m.           Physical Examination:   General appearance: alert, well appearing, and in no distress  Mental status: alert, oriented to person, place, and time  Skin: warm & dry   Extremities: Edema: None    Cardiovascular: normal heart rate noted  Respiratory: normal respiratory effort, no distress  Abdomen: gravid, soft, non-tender  Pelvic: Cervical exam deferred         Fetal Status:     Movement: Present    Fetal Surveillance Testing today: doppler  Results for orders placed or performed in visit on 02/23/18 (from the past 24 hour([name]))  POCT Urinalysis Dipstick   Collection Time: 02/23/18  2:47 PM  Result Value Ref Range   Color, UA     Clarity, UA     Glucose, UA Negative Negative   Bilirubin, UA     Ketones, UA neg    Spec Grav, UA  1.010 - 1.025   Blood, UA neg    pH, UA  5.0 - 8.0   Protein, UA Positive (A) Negative   Urobilinogen, UA  0.2 or 1.0 E.U./dL   Nitrite, UA neg    Leukocytes, UA Negative Negative   Appearance     Odor      Assessment & Plan:  1)  High-risk pregnancy G3P2002 at [redacted]w[redacted]d with an Estimated Date of Delivery: 03/24/18   2) A1DM, stable  3) borderline BP, 1+ proteinuria  4) size >dates  Labs/procedures today:  Orders Placed This Encounter  Procedures  . Urine Culture  . US OB Follow Up    Standing Status:   Future    Standing Expiration Date:   04/26/2019    Order Specific Question:   Reason for exam:    Answer:   A1DM; EFW/AFI    Order Specific Question:   Preferred imaging location?    Answer:   Internal  . CBC  . Comprehensive metabolic panel  . Protein / creatinine ratio, urine  . POCT Urinalysis Dipstick     Medications: none  Treatment Plan:  QID BS testing; IOL 40 weeks  Follow-up: Return in about 1 week (around 03/02/2018) for HROB and 10-14 days  for EFW (if >12 days, add HROB to [place] appt). (limited US appts available here or w/MFM)  Orders Placed This Encounter  Procedures  . Urine Culture  . US OB Follow Up  . CBC  . Comprehensive metabolic panel  . Protein / creatinine ratio, urine  . POCT Urinalysis Dipstick   Jacklyn Shell CNM 02/23/2018 3:22 PM

## 2018-02-23 NOTE — Patient Instructions (Signed)

## 2018-02-24 LAB — COMPREHENSIVE METABOLIC PANEL
A/G RATIO: 1.2 (ref 1.2–2.2)
ALT: 20 IU/L (ref 0–32)
AST: 22 IU/L (ref 0–40)
Albumin: 3.3 g/dL — ABNORMAL LOW (ref 3.5–5.5)
Alkaline Phosphatase: 155 IU/L — ABNORMAL HIGH (ref 39–117)
BUN/Creatinine Ratio: 10 (ref 9–23)
BUN: 5 mg/dL — AB (ref 6–20)
Bilirubin Total: 0.6 mg/dL (ref 0.0–1.2)
CALCIUM: 9.2 mg/dL (ref 8.7–10.2)
CO2: 20 mmol/L (ref 20–29)
CREATININE: 0.49 mg/dL — AB (ref 0.57–1.00)
Chloride: 103 mmol/L (ref 96–106)
GFR, EST AFRICAN AMERICAN: 148 mL/min/{1.73_m2} (ref >59)
GFR, EST NON AFRICAN AMERICAN: 128 mL/min/{1.73_m2} (ref >59)
Globulin, Total: 2.7 g/dL (ref 1.5–4.5)
Glucose: 146 mg/dL — ABNORMAL HIGH (ref 65–99)
POTASSIUM: 4.1 mmol/L (ref 3.5–5.2)
Sodium: 138 mmol/L (ref 134–144)
TOTAL PROTEIN: 6 g/dL (ref 6.0–8.5)

## 2018-02-24 LAB — CBC
HEMOGLOBIN: 12.6 g/dL (ref 11.1–15.9)
Hematocrit: 35.3 % (ref 34.0–46.6)
MCH: 29.2 pg (ref 26.6–33.0)
MCHC: 35.7 g/dL (ref 31.5–35.7)
MCV: 82 fL (ref 79–97)
Platelets: 203 10*3/uL (ref 150–450)
RBC: 4.32 x10E6/uL (ref 3.77–5.28)
RDW: 13.5 % (ref 12.3–15.4)
WBC: 8.6 10*3/uL (ref 3.4–10.8)

## 2018-02-24 LAB — PROTEIN / CREATININE RATIO, URINE
Creatinine, Urine: 258.1 mg/dL
Protein, Ur: 51.8 mg/dL
Protein/Creat Ratio: 201 mg/g creat — ABNORMAL HIGH (ref 0–200)

## 2018-02-28 ENCOUNTER — Telehealth: Payer: Self-pay | Admitting: *Deleted

## 2018-02-28 NOTE — Telephone Encounter (Signed)
Erroneous encounter

## 2018-03-02 ENCOUNTER — Telehealth: Payer: Self-pay | Admitting: *Deleted

## 2018-03-02 NOTE — Telephone Encounter (Signed)
Patient called stating that her results came in of the blood work and she would like to know what she would have to do next. Please contact pt

## 2018-03-03 ENCOUNTER — Encounter: Payer: Medicaid Other | Admitting: Advanced Practice Midwife

## 2018-03-03 NOTE — Telephone Encounter (Signed)
Patient informed FCD stated she did not have pre-e and can just keep regular scheduled app. Verbalized understanding.

## 2018-03-07 ENCOUNTER — Ambulatory Visit (INDEPENDENT_AMBULATORY_CARE_PROVIDER_SITE_OTHER): Payer: Medicaid Other

## 2018-03-07 ENCOUNTER — Ambulatory Visit (INDEPENDENT_AMBULATORY_CARE_PROVIDER_SITE_OTHER): Payer: Medicaid Other | Admitting: Women's Health

## 2018-03-07 ENCOUNTER — Encounter: Payer: Self-pay | Admitting: Women's Health

## 2018-03-07 VITALS — BP 130/81 | HR 105 | Wt 219.0 lb

## 2018-03-07 DIAGNOSIS — Z3A37 37 weeks gestation of pregnancy: Secondary | ICD-10-CM

## 2018-03-07 DIAGNOSIS — Z3483 Encounter for supervision of other normal pregnancy, third trimester: Secondary | ICD-10-CM

## 2018-03-07 DIAGNOSIS — O1213 Gestational proteinuria, third trimester: Secondary | ICD-10-CM | POA: Diagnosis not present

## 2018-03-07 DIAGNOSIS — O24410 Gestational diabetes mellitus in pregnancy, diet controlled: Secondary | ICD-10-CM

## 2018-03-07 DIAGNOSIS — O24419 Gestational diabetes mellitus in pregnancy, unspecified control: Secondary | ICD-10-CM

## 2018-03-07 DIAGNOSIS — Z1389 Encounter for screening for other disorder: Secondary | ICD-10-CM

## 2018-03-07 DIAGNOSIS — O0993 Supervision of high risk pregnancy, unspecified, third trimester: Secondary | ICD-10-CM | POA: Diagnosis not present

## 2018-03-07 DIAGNOSIS — Z331 Pregnant state, incidental: Secondary | ICD-10-CM

## 2018-03-07 DIAGNOSIS — O0990 Supervision of high risk pregnancy, unspecified, unspecified trimester: Secondary | ICD-10-CM

## 2018-03-07 DIAGNOSIS — O3663X0 Maternal care for excessive fetal growth, third trimester, not applicable or unspecified: Secondary | ICD-10-CM

## 2018-03-07 LAB — POCT URINALYSIS DIPSTICK OB
Glucose, UA: NEGATIVE — AB
KETONES UA: NEGATIVE
Leukocytes, UA: NEGATIVE
NITRITE UA: NEGATIVE
RBC UA: NEGATIVE

## 2018-03-07 MED ORDER — METFORMIN HCL 500 MG PO TABS: 500.0000 mg | ORAL_TABLET | Freq: Two times a day (BID) | ORAL | 0 refills | Status: DC

## 2018-03-07 NOTE — Progress Notes (Signed)
HIGH-RISK PREGNANCY VISIT Patient name: Peggy [name] MRN [id number]  Date of birth: Jan 11, 1986 Chief Complaint:   High Risk Gestation (gbs-gc-chl/ ultrasound efw)  History of Present Illness:   Peggy [name] is a 32 y.o. 533P2002 female at 4414w4d with an Estimated Date of Delivery: 03/24/18 being seen today for ongoing management of a high-risk pregnancy complicated by A2DM- meds added today, suspected LGA.  Today she reports no complaints. Forgot log, reports all FBS >95 (96-110s), 2hr pp in 170s. Eating bread, potatoes, drinking [place] juice when she gets tired of water. Contractions: Irregular.  .  Movement: Present. denies leaking of fluid.  Review of Systems:   Pertinent items are noted in HPI Denies abnormal vaginal discharge w/ itching/odor/irritation, headaches, visual changes, shortness of breath, chest pain, abdominal pain, severe nausea/vomiting, or problems with urination or bowel movements unless otherwise stated above. Pertinent History Reviewed:  Reviewed past medical,surgical, social, obstetrical and family history.  Reviewed problem list, medications and allergies. Physical Assessment:   Vitals:   03/07/18 1508  BP: 130/81  Pulse: (!) 105  Weight: 219 lb (99.3 kg)  Body mass index is 34.82 kg/m.           Physical Examination:   General appearance: alert, well appearing, and in no distress  Mental status: alert, oriented to person, place, and time  Skin: warm & dry   Extremities: Edema: None    Cardiovascular: normal heart rate noted  Respiratory: normal respiratory effort, no distress  Abdomen: gravid, soft, non-tender  Pelvic: Cervical exam deferred  Dilation: 3 Effacement (%): 50 Station: -2  Fetal Status: Fetal Heart Rate (bpm): 138 u/s Fundal Height: 39 cm Movement: Present Presentation: Vertex  Fetal Surveillance Testing today: US 37+4 wks,cephalic,posterior pl gr 3,bilat adnexa's wnl,afi 14 cm,fhr 138 bpm,EFW 3858 g 96%   Results for orders  placed or performed in visit on 03/07/18 (from the past 24 hour(s))  POC Urinalysis Dipstick OB   Collection Time: 03/07/18  3:15 PM  Result Value Ref Range   Color, UA     Clarity, UA     Glucose, UA Negative (A) (none)   Bilirubin, UA     Ketones, UA neg    Spec Grav, UA  1.010 - 1.025   Blood, UA neg    pH, UA  5.0 - 8.0   POC Protein UA Small (1+) (A) Negative, Trace   Urobilinogen, UA  0.2 or 1.0 E.U./dL   Nitrite, UA neg    Leukocytes, UA Negative Negative   Appearance     Odor      Assessment & Plan:  1) High-risk pregnancy G3P2002 at 7014w4d with an Estimated Date of Delivery: 03/24/18   2) A2DM, unstable, rx metformin 500mg  BID, cut out breads/potatoes/juice  3) Suspected LGA, today's EFW 96%/3858g extrapolates to 4200g at 39wks, largest baby 7lb8oz  Meds:  Meds ordered this encounter  Medications  . metFORMIN (GLUCOPHAGE) 500 MG tablet    Sig: Take 1 tablet (500 mg total) by mouth 2 (two) times daily with a meal.    Dispense:  60 tablet    Refill:  0    Order Specific Question:   Supervising Provider    Answer:   Duane LopeEURE, LUTHER H [[id number]]    Labs/procedures today: efw/afi u/s, gbs, gc/ct, sve  Treatment Plan:  2x/wk nst, deliver @ 39wks  Reviewed: Term labor symptoms and general obstetric precautions including but not limited to vaginal bleeding, contractions, leaking of fluid and fetal movement  were reviewed in detail with the patient.  All questions were answered.  Follow-up: Return for Thurs for hrob/nst.  Orders Placed This Encounter  Procedures  . Strep Gp B NAA  . GC/Chlamydia Probe Amp  . Urine Culture  . POC Urinalysis Dipstick OB   Cheral Marker CNM, [facility] 03/07/2018 4:43 PM

## 2018-03-07 NOTE — Progress Notes (Signed)
US 37+4 wks,cephalic,posterior pl gr 3,bilat adnexa's wnl,afi 14 cm,fhr 138 bpm,EFW 3858 g 96%

## 2018-03-07 NOTE — Patient Instructions (Signed)
Peggy [name], I greatly value your feedback.  If you receive a survey following your visit with us today, we appreciate you taking the time to fill it out.  Thanks, Peggy [name], CNM, WHNP-BC   Call the office [phone]) or go to [facility] if:  You begin to have strong, frequent contractions  Your water breaks.  Sometimes it is a big gush of fluid, sometimes it is just a trickle that keeps getting your panties wet or running down your legs  You have vaginal bleeding.  It is normal to have a small amount of spotting if your cervix was checked.   You don't feel your baby moving like normal.  If you don't, get you something to eat and drink and lay down and focus on feeling your baby move.  You should feel at least 10 movements in 2 hours.  If you don't, you should call the office or [facility] to [facility].     [facility] Contractions of the uterus can occur throughout pregnancy, but they are not always a sign that you are in labor. You may have practice contractions called Braxton Hicks contractions. These false labor contractions are sometimes confused with true labor. What are Peggy PeltonBraxton Hicks contractions? Braxton Hicks contractions are tightening movements that occur in the muscles of the uterus before labor. Unlike true labor contractions, these contractions do not result in opening (dilation) and thinning of the cervix. Toward the end of pregnancy (32-34 weeks), Braxton Hicks contractions can happen more often and may become stronger. These contractions are sometimes difficult to tell apart from true labor because they can be very uncomfortable. You should not feel embarrassed if you go to the hospital with false labor. Sometimes, the only way to tell if you are in true labor is for your health care provider to look for changes in the cervix. The health care provider will do a physical exam and may monitor your contractions. If you are not in true labor, the exam should  show that your cervix is not dilating and your water has not broken. If there are other health problems associated with your pregnancy, it is completely safe for you to be sent home with false labor. You may continue to have Braxton Hicks contractions until you go into true labor. How to tell the difference between true labor and false labor True labor  Contractions last 30-70 seconds.  Contractions become very regular.  Discomfort is usually felt in the top of the uterus, and it spreads to the lower abdomen and low back.  Contractions do not go away with walking.  Contractions usually become more intense and increase in frequency.  The cervix dilates and gets thinner. False labor  Contractions are usually shorter and not as strong as true labor contractions.  Contractions are usually irregular.  Contractions are often felt in the front of the lower abdomen and in the groin.  Contractions may go away when you walk around or change positions while lying down.  Contractions get weaker and are shorter-lasting as time goes on.  The cervix usually does not dilate or become thin. Follow these instructions at home:  Take over-the-counter and prescription medicines only as told by your health care provider.  Keep up with your usual exercises and follow other instructions from your health care provider.  Eat and drink lightly if you think you are going into labor.  If Braxton Hicks contractions are making you uncomfortable: ? Change your position from lying  down or resting to walking, or change from walking to resting. ? Sit and rest in a tub of warm water. ? Drink enough fluid to keep your urine pale yellow. Dehydration may cause these contractions. ? Do slow and deep breathing several times an hour.  Keep all follow-up prenatal visits as told by your health care provider. This is important. Contact a health care provider if:  You have a fever.  You have continuous pain in  your abdomen. Get help right away if:  Your contractions become stronger, more regular, and closer together.  You have fluid leaking or gushing from your vagina.  You pass blood-tinged mucus (bloody show).  You have bleeding from your vagina.  You have low back pain that you never had before.  You feel your baby's head pushing down and causing pelvic pressure.  Your baby is not moving inside you as much as it used to. Summary  Contractions that occur before labor are called Braxton Hicks contractions, false labor, or practice contractions.  Braxton Hicks contractions are usually shorter, weaker, farther apart, and less regular than true labor contractions. True labor contractions usually become progressively stronger and regular and they become more frequent.  Manage discomfort from [facility] contractions by changing position, resting in a warm bath, drinking plenty of water, or practicing deep breathing. This information is not intended to replace advice given to you by your health care provider. Make sure you discuss any questions you have with your health care provider. Document Released: 12/17/2016 Document Revised: 12/17/2016 Document Reviewed: 12/17/2016 Elsevier Interactive Patient Education  2018 [employer].

## 2018-03-09 LAB — STREP GP B NAA: Strep Gp B NAA: NEGATIVE

## 2018-03-09 LAB — URINE CULTURE

## 2018-03-09 LAB — GC/CHLAMYDIA PROBE AMP
CHLAMYDIA, DNA PROBE: NEGATIVE
NEISSERIA GONORRHOEAE BY PCR: NEGATIVE

## 2018-03-10 ENCOUNTER — Encounter (HOSPITAL_COMMUNITY): Payer: Self-pay | Admitting: *Deleted

## 2018-03-10 ENCOUNTER — Encounter: Payer: Self-pay | Admitting: Women's Health

## 2018-03-10 ENCOUNTER — Ambulatory Visit (INDEPENDENT_AMBULATORY_CARE_PROVIDER_SITE_OTHER): Payer: Medicaid Other | Admitting: Women's Health

## 2018-03-10 ENCOUNTER — Telehealth (HOSPITAL_COMMUNITY): Payer: Self-pay | Admitting: *Deleted

## 2018-03-10 VITALS — BP 133/87 | HR 95 | Wt 218.5 lb

## 2018-03-10 DIAGNOSIS — Z331 Pregnant state, incidental: Secondary | ICD-10-CM

## 2018-03-10 DIAGNOSIS — O24415 Gestational diabetes mellitus in pregnancy, controlled by oral hypoglycemic drugs: Secondary | ICD-10-CM

## 2018-03-10 DIAGNOSIS — O24419 Gestational diabetes mellitus in pregnancy, unspecified control: Secondary | ICD-10-CM

## 2018-03-10 DIAGNOSIS — Z3A38 38 weeks gestation of pregnancy: Secondary | ICD-10-CM

## 2018-03-10 DIAGNOSIS — O0993 Supervision of high risk pregnancy, unspecified, third trimester: Secondary | ICD-10-CM

## 2018-03-10 DIAGNOSIS — Z1389 Encounter for screening for other disorder: Secondary | ICD-10-CM

## 2018-03-10 LAB — POCT URINALYSIS DIPSTICK OB
GLUCOSE, UA: NEGATIVE — AB
Ketones, UA: NEGATIVE
Nitrite, UA: NEGATIVE
POC,PROTEIN,UA: NEGATIVE

## 2018-03-10 NOTE — Patient Instructions (Addendum)
Peggy [name], I greatly value your feedback.  If you receive a survey following your visit with us today, we appreciate you taking the time to fill it out.  Thanks, Joellyn HaffKim Booker, CNM, WHNP-BC  Increase your metformin to 1,000mg  twice daily (take 2 of your 500mg  tablets to equal 1,000mg )  Your induction is scheduled for 8/1 @ 7am. [facility] and let them know you are there to be induced. They will send someone from Labor & Delivery to come get you.     Call the office [phone]) or go to [facility] if:  You begin to have strong, frequent contractions  Your water breaks.  Sometimes it is a big gush of fluid, sometimes it is just a trickle that keeps getting your panties wet or running down your legs  You have vaginal bleeding.  It is normal to have a small amount of spotting if your cervix was checked.   You don't feel your baby moving like normal.  If you don't, get you something to eat and drink and lay down and focus on feeling your baby move.  You should feel at least 10 movements in 2 hours.  If you don't, you should call the office or go to [facility].     [facility] Contractions Contractions of the uterus can occur throughout pregnancy, but they are not always a sign that you are in labor. You may have practice contractions called Braxton Hicks contractions. These false labor contractions are sometimes confused with true labor. What are Deberah PeltonBraxton Hicks contractions? Braxton Hicks contractions are tightening movements that occur in the muscles of the uterus before labor. Unlike true labor contractions, these contractions do not result in opening (dilation) and thinning of the cervix. Toward the end of pregnancy (32-34 weeks), Braxton Hicks contractions can happen more often and may become stronger. These contractions are sometimes difficult to tell apart from true labor because they can be very uncomfortable. You  should not feel embarrassed if you go to the hospital with false labor. Sometimes, the only way to tell if you are in true labor is for your health care provider to look for changes in the cervix. The health care provider will do a physical exam and may monitor your contractions. If you are not in true labor, the exam should show that your cervix is not dilating and your water has not broken. If there are other health problems associated with your pregnancy, it is completely safe for you to be sent home with false labor. You may continue to have Braxton Hicks contractions until you go into true labor. How to tell the difference between true labor and false labor True labor  Contractions last 30-70 seconds.  Contractions become very regular.  Discomfort is usually felt in the top of the uterus, and it spreads to the lower abdomen and low back.  Contractions do not go away with walking.  Contractions usually become more intense and increase in frequency.  The cervix dilates and gets thinner. False labor  Contractions are usually shorter and not as strong as true labor contractions.  Contractions are usually irregular.  Contractions are often felt in the front of the lower abdomen and in the groin.  Contractions may go away when you walk around or change positions while lying down.  Contractions get weaker and are shorter-lasting as time goes on.  The cervix usually does not dilate or become thin. Follow these instructions at  home:  Take over-the-counter and prescription medicines only as told by your health care provider.  Keep up with your usual exercises and follow other instructions from your health care provider.  Eat and drink lightly if you think you are going into labor.  If Braxton Hicks contractions are making you uncomfortable: ? Change your position from lying down or resting to walking, or change from walking to resting. ? Sit and rest in a tub of warm  water. ? Drink enough fluid to keep your urine pale yellow. Dehydration may cause these contractions. ? Do slow and deep breathing several times an hour.  Keep all follow-up prenatal visits as told by your health care provider. This is important. Contact a health care provider if:  You have a fever.  You have continuous pain in your abdomen. Get help right away if:  Your contractions become stronger, more regular, and closer together.  You have fluid leaking or gushing from your vagina.  You pass blood-tinged mucus (bloody show).  You have bleeding from your vagina.  You have low back pain that you never had before.  You feel your baby's head pushing down and causing pelvic pressure.  Your baby is not moving inside you as much as it used to. Summary  Contractions that occur before labor are called Braxton Hicks contractions, false labor, or practice contractions.  Braxton Hicks contractions are usually shorter, weaker, farther apart, and less regular than true labor contractions. True labor contractions usually become progressively stronger and regular and they become more frequent.  Manage discomfort from [facility] contractions by changing position, resting in a warm bath, drinking plenty of water, or practicing deep breathing. This information is not intended to replace advice given to you by your health care provider. Make sure you discuss any questions you have with your health care provider. Document Released: 12/17/2016 Document Revised: 12/17/2016 Document Reviewed: 12/17/2016 Elsevier Interactive Patient Education  2018 [employer].

## 2018-03-10 NOTE — Progress Notes (Signed)
   HIGH-RISK PREGNANCY VISIT Patient name: Peggy [name] MRN [id number]  Date of birth: 10-Aug-1986 Chief Complaint:   High Risk Gestation (NST; + contractions)  History of Present Illness:   Peggy [name] is a 32 y.o. 203P2002 female at 72102w0d with an Estimated Date of Delivery: 03/24/18 being seen today for ongoing management of a high-risk pregnancy complicated by A2DM currently on metformin 500mg  BID.  Today she reports didn't bring log again today. Started metformin 500mg  bid as directed after last visit. FBS all >95 (95-103), 2hr pp all >120 (140s). Still eating/drinking things she shouldn't.  Contractions: Irregular. Vag. Bleeding: None.  Movement: Present. denies leaking of fluid.  Review of Systems:   Pertinent items are noted in HPI Denies abnormal vaginal discharge w/ itching/odor/irritation, headaches, visual changes, shortness of breath, chest pain, abdominal pain, severe nausea/vomiting, or problems with urination or bowel movements unless otherwise stated above. Pertinent History Reviewed:  Reviewed past medical,surgical, social, obstetrical and family history.  Reviewed problem list, medications and allergies. Physical Assessment:   Vitals:   03/10/18 0906  BP: 133/87  Pulse: 95  Weight: 218 lb 8 oz (99.1 kg)  Body mass index is 34.74 kg/m.           Physical Examination:   General appearance: alert, well appearing, and in no distress  Mental status: alert, oriented to person, place, and time  Skin: warm & dry   Extremities: Edema: Trace    Cardiovascular: normal heart rate noted  Respiratory: normal respiratory effort, no distress  Abdomen: gravid, soft, non-tender  Pelvic: Cervical exam performed  Dilation: 4 Effacement (%): 50 Station: -2  Fetal Status:     Movement: Present Presentation: Vertex  Fetal Surveillance Testing today: NST: FHR baseline 125 bpm, Variability: moderate, Accelerations:present, Decelerations:  Absent= Cat 1/Reactive Toco: irregular      Results for orders placed or performed in visit on 03/10/18 (from the past 24 hour(s))  POC Urinalysis Dipstick OB   Collection Time: 03/10/18  9:07 AM  Result Value Ref Range   Color, UA     Clarity, UA     Glucose, UA Negative (A) (none)   Bilirubin, UA     Ketones, UA neg    Spec Grav, UA  1.010 - 1.025   Blood, UA trace    pH, UA  5.0 - 8.0   POC Protein UA Negative Negative, Trace   Urobilinogen, UA  0.2 or 1.0 E.U./dL   Nitrite, UA neg    Leukocytes, UA Trace (A) Negative   Appearance     Odor      Assessment & Plan:  1) High-risk pregnancy G3P2002 at 45102w0d with an Estimated Date of Delivery: 03/24/18   2) A2DM, unstable, increase metformin to 1,000mg  BID  Meds: No orders of the defined types were placed in this encounter.  Labs/procedures today: nst, sve  Treatment Plan:  Mon for nst/hrob, then IOL scheduled for 8/1 @ 0700.  IOL form faxed via Epic and orders placed   Reviewed: Term labor symptoms and general obstetric precautions including but not limited to vaginal bleeding, contractions, leaking of fluid and fetal movement were reviewed in detail with the patient.  All questions were answered.  Follow-up: Return for As scheduled Mon for hrob/nst.  Orders Placed This Encounter  Procedures  . POC Urinalysis Dipstick OB   Cheral MarkerKimberly R Deyanira Fesler CNM, [facility]NP-BC 03/10/2018 10:02 AM

## 2018-03-10 NOTE — Treatment Plan (Signed)
Induction Assessment Scheduling Form Fax to [facility]:  [phone]  Peggy [name]                                                                                   DOB:  27-Jul-1986                                                            MRN:  [id number]                                                                     Phone #:   [phone]                         Provider:  [facility]  GP:  [id number]                                                            Estimated Date of Delivery: 03/24/18  Dating Criteria: LMP c/w 5wk u/s    Medical Indications for induction:  A2DM Admission Date/Time:  8/1 @ 0700 Gestational age on admission:  39.0   Filed Weights   03/10/18 0906  Weight: 218 lb 8 oz (99.1 kg)   HIV:  Non Reactive (05/30 0835) GBS: Negative (07/22 1600)  4/50/-2, vtx   Method of induction(proposed):  Pit/arom   Scheduling Provider Signature:  Cheral MarkerKimberly R Maigen Mozingo, CNM                                            Today's Date:  03/10/2018

## 2018-03-10 NOTE — Telephone Encounter (Signed)
Preadmission screen  

## 2018-03-14 ENCOUNTER — Other Ambulatory Visit: Payer: Medicaid Other | Admitting: Obstetrics and Gynecology

## 2018-03-17 ENCOUNTER — Inpatient Hospital Stay (HOSPITAL_COMMUNITY)
Admission: RE | Admit: 2018-03-17 | Discharge: 2018-03-19 | DRG: 806 | Disposition: A | Discharge status: Home / Self Care | Payer: Medicaid Other | Source: Ambulatory Visit | Attending: Obstetrics & Gynecology | Admitting: Obstetrics & Gynecology

## 2018-03-17 ENCOUNTER — Other Ambulatory Visit: Payer: Self-pay

## 2018-03-17 ENCOUNTER — Encounter (HOSPITAL_COMMUNITY): Payer: Self-pay

## 2018-03-17 DIAGNOSIS — E559 Vitamin D deficiency, unspecified: Secondary | ICD-10-CM | POA: Diagnosis present

## 2018-03-17 DIAGNOSIS — O0990 Supervision of high risk pregnancy, unspecified, unspecified trimester: Secondary | ICD-10-CM

## 2018-03-17 DIAGNOSIS — F17210 Nicotine dependence, cigarettes, uncomplicated: Secondary | ICD-10-CM | POA: Diagnosis present

## 2018-03-17 DIAGNOSIS — Z3A39 39 weeks gestation of pregnancy: Secondary | ICD-10-CM

## 2018-03-17 DIAGNOSIS — O99334 Smoking (tobacco) complicating childbirth: Secondary | ICD-10-CM | POA: Diagnosis present

## 2018-03-17 DIAGNOSIS — O24419 Gestational diabetes mellitus in pregnancy, unspecified control: Secondary | ICD-10-CM

## 2018-03-17 DIAGNOSIS — O24425 Gestational diabetes mellitus in childbirth, controlled by oral hypoglycemic drugs: Principal | ICD-10-CM | POA: Diagnosis present

## 2018-03-17 DIAGNOSIS — O10919 Unspecified pre-existing hypertension complicating pregnancy, unspecified trimester: Secondary | ICD-10-CM | POA: Diagnosis present

## 2018-03-17 DIAGNOSIS — O43123 Velamentous insertion of umbilical cord, third trimester: Secondary | ICD-10-CM | POA: Diagnosis not present

## 2018-03-17 DIAGNOSIS — Z8632 Personal history of gestational diabetes: Secondary | ICD-10-CM

## 2018-03-17 DIAGNOSIS — O1002 Pre-existing essential hypertension complicating childbirth: Secondary | ICD-10-CM | POA: Diagnosis present

## 2018-03-17 LAB — CBC
HEMATOCRIT: 33.4 % — AB (ref 36.0–46.0)
Hemoglobin: 11.8 g/dL — ABNORMAL LOW (ref 12.0–15.0)
MCH: 28.6 pg (ref 26.0–34.0)
MCHC: 35.3 g/dL (ref 30.0–36.0)
MCV: 80.9 fL (ref 78.0–100.0)
Platelets: 174 10*3/uL (ref 150–400)
RBC: 4.13 MIL/uL (ref 3.87–5.11)
RDW: 13.1 % (ref 11.5–15.5)
WBC: 8.2 10*3/uL (ref 4.0–10.5)

## 2018-03-17 LAB — TYPE AND SCREEN
ABO/RH(D): O POS
Antibody Screen: NEGATIVE

## 2018-03-17 LAB — MRSA PCR SCREENING: MRSA by PCR: POSITIVE — AB

## 2018-03-17 LAB — GLUCOSE, CAPILLARY
Glucose-Capillary: 90 mg/dL (ref 70–99)
Glucose-Capillary: 100 mg/dL — ABNORMAL HIGH (ref 70–99)
Glucose-Capillary: 89 mg/dL (ref 70–99)

## 2018-03-17 MED ORDER — SOD CITRATE-CITRIC ACID 500-334 MG/5ML PO SOLN: 30.0000 mL | ORAL | Status: DC | PRN

## 2018-03-17 MED ORDER — ONDANSETRON HCL 4 MG/2ML IJ SOLN: 4.0000 mg | INTRAMUSCULAR | Status: DC | PRN

## 2018-03-17 MED ORDER — LACTATED RINGERS IV SOLN: 500.0000 mL | INTRAVENOUS | Status: DC | PRN

## 2018-03-17 MED ORDER — HYDROXYZINE HCL 50 MG PO TABS
50.0000 mg | ORAL_TABLET | Freq: Four times a day (QID) | ORAL | Status: DC | PRN
  Filled 2018-03-17: qty 1

## 2018-03-17 MED ORDER — TETANUS-DIPHTH-ACELL PERTUSSIS 5-2.5-18.5 LF-MCG/0.5 IM SUSP: 0.5000 mL | Freq: Once | INTRAMUSCULAR | Status: DC

## 2018-03-17 MED ORDER — FENTANYL CITRATE (PF) 100 MCG/2ML IJ SOLN
50.0000 ug | INTRAMUSCULAR | Status: DC | PRN
Start: 2018-03-17 — End: 2018-03-17
  Administered 2018-03-17: 100 ug via INTRAVENOUS
  Filled 2018-03-17 (×2): qty 2

## 2018-03-17 MED ORDER — COCONUT OIL OIL
1.0000 "application " | TOPICAL_OIL | Status: DC | PRN
  Administered 2018-03-19: 1 via TOPICAL
  Filled 2018-03-17: qty 120

## 2018-03-17 MED ORDER — ONDANSETRON HCL 4 MG/2ML IJ SOLN: 4.0000 mg | Freq: Four times a day (QID) | INTRAMUSCULAR | Status: DC | PRN

## 2018-03-17 MED ORDER — TERBUTALINE SULFATE 1 MG/ML IJ SOLN
0.2500 mg | Freq: Once | INTRAMUSCULAR | Status: DC | PRN
  Filled 2018-03-17: qty 1

## 2018-03-17 MED ORDER — OXYTOCIN 40 UNITS IN LACTATED RINGERS INFUSION - SIMPLE MED
2.5000 [IU]/h | INTRAVENOUS | Status: DC
  Administered 2018-03-17: 2.5 [IU]/h via INTRAVENOUS

## 2018-03-17 MED ORDER — ONDANSETRON HCL 4 MG PO TABS: 4.0000 mg | ORAL_TABLET | ORAL | Status: DC | PRN

## 2018-03-17 MED ORDER — OXYTOCIN 40 UNITS IN LACTATED RINGERS INFUSION - SIMPLE MED
1.0000 m[IU]/min | INTRAVENOUS | Status: DC
  Administered 2018-03-17: 2 m[IU]/min via INTRAVENOUS
  Administered 2018-03-17: 4 m[IU]/min via INTRAVENOUS
  Administered 2018-03-17: 8 m[IU]/min via INTRAVENOUS
  Administered 2018-03-17: 6 m[IU]/min via INTRAVENOUS
  Filled 2018-03-17: qty 1000

## 2018-03-17 MED ORDER — ACETAMINOPHEN 325 MG PO TABS: 650.0000 mg | ORAL_TABLET | ORAL | Status: DC | PRN

## 2018-03-17 MED ORDER — IBUPROFEN 600 MG PO TABS
600.0000 mg | ORAL_TABLET | Freq: Four times a day (QID) | ORAL | Status: DC
  Administered 2018-03-17 – 2018-03-19 (×8): 600 mg via ORAL
  Filled 2018-03-17 (×8): qty 1

## 2018-03-17 MED ORDER — ACETAMINOPHEN 325 MG PO TABS
650.0000 mg | ORAL_TABLET | ORAL | Status: DC | PRN
  Administered 2018-03-18: 650 mg via ORAL
  Filled 2018-03-17: qty 2

## 2018-03-17 MED ORDER — BENZOCAINE-MENTHOL 20-0.5 % EX AERO
1.0000 "application " | INHALATION_SPRAY | CUTANEOUS | Status: DC | PRN
  Administered 2018-03-17: 1 via TOPICAL
  Filled 2018-03-17: qty 56

## 2018-03-17 MED ORDER — PRENATAL MULTIVITAMIN CH
1.0000 | ORAL_TABLET | Freq: Every day | ORAL | Status: DC
  Administered 2018-03-18 – 2018-03-19 (×2): 1 via ORAL
  Filled 2018-03-17 (×2): qty 1

## 2018-03-17 MED ORDER — SIMETHICONE 80 MG PO CHEW: 80.0000 mg | CHEWABLE_TABLET | ORAL | Status: DC | PRN

## 2018-03-17 MED ORDER — OXYCODONE-ACETAMINOPHEN 5-325 MG PO TABS: 1.0000 | ORAL_TABLET | ORAL | Status: DC | PRN

## 2018-03-17 MED ORDER — MISOPROSTOL 200 MCG PO TABS
ORAL_TABLET | ORAL | Status: AC
  Filled 2018-03-17: qty 4

## 2018-03-17 MED ORDER — OXYCODONE-ACETAMINOPHEN 5-325 MG PO TABS: 2.0000 | ORAL_TABLET | ORAL | Status: DC | PRN

## 2018-03-17 MED ORDER — WITCH HAZEL-GLYCERIN EX PADS: 1.0000 "application " | MEDICATED_PAD | CUTANEOUS | Status: DC | PRN

## 2018-03-17 MED ORDER — ZOLPIDEM TARTRATE 5 MG PO TABS: 5.0000 mg | ORAL_TABLET | Freq: Every evening | ORAL | Status: DC | PRN

## 2018-03-17 MED ORDER — FLEET ENEMA 7-19 GM/118ML RE ENEM: 1.0000 | ENEMA | RECTAL | Status: DC | PRN

## 2018-03-17 MED ORDER — DIBUCAINE 1 % RE OINT: 1.0000 "application " | TOPICAL_OINTMENT | RECTAL | Status: DC | PRN

## 2018-03-17 MED ORDER — OXYTOCIN BOLUS FROM INFUSION
500.0000 mL | Freq: Once | INTRAVENOUS | Status: AC
  Administered 2018-03-17: 500 mL via INTRAVENOUS

## 2018-03-17 MED ORDER — LIDOCAINE HCL (PF) 1 % IJ SOLN
30.0000 mL | INTRAMUSCULAR | Status: DC | PRN
  Filled 2018-03-17: qty 30

## 2018-03-17 MED ORDER — DIPHENHYDRAMINE HCL 25 MG PO CAPS: 25.0000 mg | ORAL_CAPSULE | Freq: Four times a day (QID) | ORAL | Status: DC | PRN

## 2018-03-17 MED ORDER — SENNOSIDES-DOCUSATE SODIUM 8.6-50 MG PO TABS
2.0000 | ORAL_TABLET | ORAL | Status: DC
  Administered 2018-03-17 – 2018-03-19 (×2): 2 via ORAL
  Filled 2018-03-17 (×2): qty 2

## 2018-03-17 MED ORDER — LACTATED RINGERS IV SOLN
INTRAVENOUS | Status: DC
  Administered 2018-03-17: 08:00:00 via INTRAVENOUS

## 2018-03-17 NOTE — H&P (Signed)
LABOR AND DELIVERY ADMISSION HISTORY AND PHYSICAL NOTE  Peggy [name] is a 32 y.o. female G3P2002 with IUP at [redacted]w[redacted]d by LMP presenting for IOL for A2GDM- on Metformin 1,000 BID.  She reports positive fetal movement. She denies leakage of fluid or vaginal bleeding.  Prenatal History/Complications: PNC at [facility] Pregnancy complications:  - Vitamin D deficiency  - Tobacco use  - Chronic Hypertension   Past Medical History: Past Medical History:  Diagnosis Date  . Gestational diabetes    metformin  . Medical history non-contributory   . Pregnancy induced hypertension     Past Surgical History: Past Surgical History:  Procedure Laterality Date  . COLPOSCOPY      Obstetrical History: OB History    Gravida  3   Para  2   Term  2   Preterm      AB      Living  2     SAB      TAB      Ectopic      Multiple      Live Births  2           Social History: Social History   Socioeconomic History  . Marital status: Single    Spouse name: Not on file  . Number of children: Not on file  . Years of education: Not on file  . Highest education level: Not on file  Occupational History  . Not on file  Social Needs  . Financial resource strain: Not on file  . Food insecurity:    Worry: Not on file    Inability: Not on file  . Transportation needs:    Medical: Not on file    Non-medical: Not on file  Tobacco Use  . Smoking status: Current Every Day Smoker    Packs/day: 0.25    Types: Cigarettes  . Smokeless tobacco: Never Used  Substance and Sexual Activity  . Alcohol use: No    Frequency: Never  . Drug use: No  . Sexual activity: Yes    Birth control/protection: None  Lifestyle  . Physical activity:    Days per week: Not on file    Minutes per session: Not on file  . Stress: Not on file  Relationships  . Social connections:    Talks on phone: Not on file    Gets together: Not on file    Attends religious service: Not on file    Active member of  club or organization: Not on file    Attends meetings of clubs or organizations: Not on file    Relationship status: Not on file  Other Topics Concern  . Not on file  Social History Narrative  . Not on file    Family History: Family History  Problem Relation Age of Onset  . Heart disease Mother   . Hypertension Mother   . Asthma Daughter   . Asthma Son   . Diabetes Maternal Grandmother     Allergies: Allergies  Allergen Reactions  . Aspirin Swelling    ORAL SWELLING    Medications Prior to Admission  Medication Sig Dispense Refill Last Dose  . metFORMIN (GLUCOPHAGE) 500 MG tablet Take 1 tablet (500 mg total) by mouth 2 (two) times daily with a meal. 60 tablet 0 03/17/2018 at Unknown time  . pantoprazole (PROTONIX) 20 MG tablet Take 1 tablet (20 mg total) by mouth daily. 30 tablet 6 03/16/2018 at Unknown time  . Prenatal Vit-Fe Fumarate-FA (PREPLUS) 27-1  MG TABS Take 1 tablet by mouth daily. (Patient not taking: Reported on 03/17/2018) 30 tablet 13 Not Taking at Unknown time     Review of Systems  All systems reviewed and negative except as stated in HPI  Physical Exam Blood pressure 109/65, pulse 90, temperature 98 F (36.7 C), temperature source Oral, resp. rate 18, height 5\' 5"  (1.651 m), weight 221 lb (100.2 kg), last menstrual period 06/17/2017. General appearance: alert, cooperative and no distress Lungs: clear to auscultation bilaterally Heart: regular rate and rhythm Abdomen: soft, non-tender; bowel sounds normal Extremities: No calf swelling or tenderness Presentation: cephalic by cervical examination  Fetal monitoring: 135/ moderate variability/ +accelerations/ no decel  Uterine activity: 6-10 minutes, mild by palpation  Dilation: 3.5 Effacement (%): 60 Station: -2 Exam by:: VDodson RN  Prenatal labs: ABO, Rh: --/--/O POS (08/01 0818) Antibody: NEG (08/01 0818) Rubella: 5.85 (01/17 1558) RPR: Non Reactive (05/30 0835)  HBsAg: Negative (01/17 1558)  HIV:  Non Reactive (05/30 0835)  GC/Chlamydia: negative (7/22) GBS: Negative (07/22 1600)  2 hr Glucola: 97-176-160 Genetic screening:  Negative  Anatomy US: Normal female   Clinic   [facility]>transfer to [facility] at 23 weeks Prenatal Labs  Dating   LMP Blood type: O/Positive/-- (01/17 1558)   Genetic Screen  AFP: neg Antibody:Negative (01/17 1558)  Anatomic US Normal female Rubella: 5.85 (01/17 1558)  GTT Early:  A1C:normal             Third trimester:  RPR: Non Reactive (01/17 1558)   Flu vaccine  declined 09/02/2017 HBsAg: Negative (01/17 1558)   TDaP vaccine                                               Rhogam:n/a O+ HIV: Non Reactive (01/17 1558)   Baby Food       Breast and bottle                                         GBS: neg  Contraception    Yes, undecided method Pap: 09/02/17: normal  [facility]   Pediatrician   [facility] CF: Neg  Support Person   FOB-Michael Harris, Mother-Patricia Simmons   Prenatal Classes   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   Prenatal Transfer Tool  Maternal Diabetes: Yes:  Diabetes Type:  Insulin/Medication controlled Genetic Screening: Normal Maternal Ultrasounds/Referrals: Normal Fetal Ultrasounds or other Referrals:  None Maternal Substance Abuse:  Yes:  Type: Smoker Significant Maternal Medications:  Meds include: Other: Metformin Significant Maternal Lab Results: Lab values include: Group B Strep negative  Results for orders placed or performed during the hospital encounter of 03/17/18 (from the past 24 hour(s))  CBC   Collection Time: 03/17/18  8:18 AM  Result Value Ref Range   WBC 8.2 4.0 - 10.5 K/uL   RBC 4.13 3.87 - 5.11 MIL/uL   Hemoglobin 11.8 (L) 12.0 - 15.0 g/dL   HCT 62.133.4 (L) 30.836.0 - 65.746.0 %   MCV 80.9 78.0 - 100.0 fL   MCH 28.6 26.0 - 34.0 pg   MCHC 35.3 30.0 - 36.0 g/dL   RDW 84.613.1 96.211.5 - 95.215.5 %   Platelets 174 150 - 400 K/uL  Type and screen   Collection  Time: 03/17/18  8:18 AM  Result Value Ref Range    ABO/RH(D) O POS    Antibody Screen NEG    Sample Expiration      03/20/2018 Performed at [facility], [address], [place], [place] [address]   Glucose, capillary   Collection Time: 03/17/18  8:28 AM  Result Value Ref Range   Glucose-Capillary 90 70 - 99 mg/dL  MRSA PCR Screening   Collection Time: 03/17/18  8:55 AM  Result Value Ref Range   MRSA by PCR POSITIVE (A) NEGATIVE    Patient Active Problem List   Diagnosis Date Noted  . Gestational diabetes mellitus, class A2 01/14/2018  . Vitamin D deficiency 09/08/2017  . Supervision of high risk pregnancy, antepartum 09/02/2017    Assessment: Peggy [name] is a 32 y.o. G3P2002 at [redacted]w[redacted]d here for IOL for A2GDM on Metformin   #Labor: IOL with Pitocin due to cervical dilation  #Pain: Plans IV pain medication, does not was epidural  #FWB: Cat I  #ID:  GBS neg  #MOF: Breast and Bottle  #MOC: Depo  #Circ:  Yes (outpatient at [facility])  Sharyon Cable, CNM 03/17/2018, 10:25 AM

## 2018-03-17 NOTE — Progress Notes (Signed)
LABOR PROGRESS NOTE  Peggy [name] is a 32 y.o. G3P2002 at 6353w0d  admitted for IOL for A2GDM  Subjective: Patient breathing through contractions, does not want epidural during labor- received one dose IV Fentanyl at this time   Objective: BP 135/70   Pulse 94   Temp 98.3 F (36.8 [name]) (Oral)   Resp 18   Ht 5\' 5"  (1.651 m)   Wt 221 lb (100.2 kg)   LMP 06/17/2017 (Exact Date)   BMI 36.78 kg/m  or  Vitals:   03/17/18 1200 03/17/18 1230 03/17/18 1303 03/17/18 1332  BP: 129/75 133/76 134/80 135/70  Pulse: 86 91 87 94  Resp: 20 20 18 18   Temp: 98.3 F (36.8 [name])     TempSrc: Oral     Weight:      Height:        SROM clear fluid @1120  Dilation: 6 Effacement (%): 80 Cervical Position: Posterior Station: Plus 1 Presentation: Vertex Exam by:: Gifford ShaveYancey Luft RN  FHT: baseline rate 120, moderate varibility, +acel, early decel Toco: 2-3/ moderate- strong by palpation   Labs: Lab Results  Component Value Date   WBC 8.2 03/17/2018   HGB 11.8 (L) 03/17/2018   HCT 33.4 (L) 03/17/2018   MCV 80.9 03/17/2018   PLT 174 03/17/2018    Patient Active Problem List   Diagnosis Date Noted  . Gestational diabetes mellitus, class A2 01/14/2018  . Vitamin D deficiency 09/08/2017  . Supervision of high risk pregnancy, antepartum 09/02/2017    Assessment / Plan: 32 y.o. G3P2002 at 6253w0d here for IOL for A2GDM  Labor: Progressing well on pitocin, expectant management  Fetal Wellbeing:  Cat I  Pain Control:  IV Fentanyl  Anticipated MOD:  SVD  Sharyon CableRogers, Peggy [name], CNM 03/17/2018, 1:39 PM

## 2018-03-17 NOTE — Lactation Note (Signed)
This note was copied from a baby's chart. Lactation Consultation Note Baby 9 hrs old. Mom states baby is sleepy, has no interest in BF at this time. Encouraged to cont. W/cues and stimulate every 3 hrs if no cues. Mom states she wants to be breast/formula feeding. Mom has requested formula all ready but was encouraged to wait and cont. To try just to give BM. Mom encouraged to feed baby 8-12 times/24 hours and with feeding cues. Newborn behavior, STS, I&O discussed. Wake baby every 3 hrs to feed if hasn't cued. Call for assistance or questions.  [facility] brochure given w/resources, support groups and LC services.  Patient Name: Peggy Emogene MorganLatise Sass RUEAV'WToday's Date: 03/17/2018 Reason for consult: Initial assessment   Maternal Data Has patient been taught Hand Expression?: Yes Does the patient have breastfeeding experience prior to this delivery?: Yes  Feeding Feeding Type: Breast Fed  LATCH Score                   Interventions Interventions: Breast feeding basics reviewed  Lactation Tools Discussed/Used     Consult Status Consult Status: Follow-up Date: 03/18/18 Follow-up type: In-patient    Charyl DancerCARVER, Yarianna Varble G 03/17/2018, 11:45 PM

## 2018-03-18 LAB — RPR: RPR Ser Ql: NONREACTIVE

## 2018-03-18 LAB — GLUCOSE, CAPILLARY: Glucose-Capillary: 81 mg/dL (ref 70–99)

## 2018-03-18 NOTE — Progress Notes (Signed)
I called infectious disease regarding the need for getting MRSA + PCR orders for the patient. She recommended starting those orders. However Dr. Earlene PlaterWallace called and she stated at this point it was necessary to treat the patient for positive MRSA. Contact precautions in place still. Infectious disease stated it was OK for patient to walk hallways.

## 2018-03-18 NOTE — Progress Notes (Signed)
Post Partum Day 1 Subjective: no complaints, up ad lib, voiding and tolerating PO, small lochia, plans to breastfeed, plans to bottle feed, Depo-Provera  Objective: Blood pressure 111/84, pulse 88, temperature 97.6 F (36.4 C), temperature source Oral, resp. rate 18, height 5\' 5"  (1.651 m), weight 100.2 kg (221 lb), last menstrual period 06/17/2017, SpO2 98 %.  Physical Exam:  General: alert, cooperative and no distress Lochia:normal flow Chest: CTAB Heart: RRR no m/r/g Abdomen: +BS, soft, nontender,  Uterine Fundus: firm DVT Evaluation: No evidence of DVT seen on physical exam. Extremities: trace edema  Recent Labs    03/17/18 0818  HGB 11.8*  HCT 33.4*    Assessment/Plan: Plan for discharge tomorrow   LOS: 1 day   Jacklyn ShellFrances Cresenzo-Dishmon 03/18/2018, 9:03 AM

## 2018-03-19 MED ORDER — IBUPROFEN 600 MG PO TABS: 600.0000 mg | ORAL_TABLET | Freq: Four times a day (QID) | ORAL | 0 refills | Status: DC | PRN

## 2018-03-19 NOTE — Discharge Summary (Signed)
OB Discharge Summary     Patient Name: Peggy [name] DOB: 22-Jun-1986 MRN: [id number]  Date of admission: 03/17/2018 Delivering MD: Sharyon CableOGERS, VERONICA C   Date of discharge: 03/19/2018  Admitting diagnosis: INDUCTION Intrauterine pregnancy: 5655w2d     Secondary diagnosis:  Active Problems:   Supervision of high risk pregnancy, antepartum   Vitamin D deficiency   Gestational diabetes mellitus, class A2   SVD (spontaneous vaginal delivery)   Chronic hypertension during pregnancy  Additional problems: GBS neg     Discharge diagnosis: Term Pregnancy Delivered, CHTN and GDM A2                                                                                                Post partum procedures:none  Augmentation: Pitocin  Complications: None  Hospital course:  Induction of Labor With Vaginal Delivery   32 y.o. yo G3P2002 at 4855w2d was admitted to the hospital 03/17/2018 for induction of labor.  Indication for induction: A2 DM.  Pt also with cHTN that did not require medication. Patient had an uncomplicated labor course and delivered by the afternoon of her induction: Membrane Rupture Time/Date: 11:20 AM ,03/17/2018   Intrapartum Procedures: Episiotomy: None [1]                                         Lacerations:  None [1]  Patient had delivery of a Viable infant.  Information for the patient's newborn:  Peggy [name], Peggy [name] [[id number]][[id number]]  Delivery Method: Vaginal, Spontaneous(Filed from Delivery Summary)   03/17/2018  Details of delivery can be found in separate delivery note.  Patient had a routine postpartum course. Her FBS on PPD#1 was 81. Patient is discharged home 03/19/18.  Physical exam  Vitals:   03/18/18 0905 03/18/18 1409 03/18/18 2234 03/19/18 0638  BP: 128/80 112/75 108/86 108/75  Pulse:  87 81 85  Resp: 18 18 14 18   Temp: 98.2 F (36.8 C) 98 F (36.7 C) 98.2 F (36.8 C) 98.1 F (36.7 C)  TempSrc:  Oral Oral Oral  SpO2: 95% 99% 100%   Weight:      Height:        General: alert and cooperative Lochia: appropriate Uterine Fundus: firm Incision: N/A DVT Evaluation: No evidence of DVT seen on physical exam. Labs: Lab Results  Component Value Date   WBC 8.2 03/17/2018   HGB 11.8 (L) 03/17/2018   HCT 33.4 (L) 03/17/2018   MCV 80.9 03/17/2018   PLT 174 03/17/2018   CMP Latest Ref Rng & Units 02/23/2018  Glucose 65 - 99 mg/dL 782(N146(H)  BUN 6 - 20 mg/dL 5(L)  Creatinine 5.620.57 - 1.00 mg/dL 1.30(Q0.49(L)  Sodium 657134 - 846144 mmol/L 138  Potassium 3.5 - 5.2 mmol/L 4.1  Chloride 96 - 106 mmol/L 103  CO2 20 - 29 mmol/L 20  Calcium 8.7 - 10.2 mg/dL 9.2  Total Protein 6.0 - 8.5 g/dL 6.0  Total Bilirubin 0.0 - 1.2 mg/dL 0.6  Alkaline Phos 39 - 117 IU/L 155(H)  AST 0 - 40 IU/L 22  ALT 0 - 32 IU/L 20    Discharge instruction: per After Visit Summary and "Baby and Me Booklet".  After visit meds:  Allergies as of 03/19/2018      Reactions   Aspirin Swelling   ORAL SWELLING. Pt has tolerated ibuprofen in the past      Medication List    STOP taking these medications   metFORMIN 500 MG tablet Commonly known as:  GLUCOPHAGE   pantoprazole 20 MG tablet Commonly known as:  PROTONIX     TAKE these medications   ibuprofen 600 MG tablet Commonly known as:  ADVIL,MOTRIN Take 1 tablet (600 mg total) by mouth every 6 (six) hours as needed.   PREPLUS 27-1 MG Tabs Take 1 tablet by mouth daily.       Diet: routine diet  Activity: Advance as tolerated. Pelvic rest for 6 weeks.   Outpatient follow up:1wk for BP check; 6wks for PP visit with GTT Follow up Appt: Future Appointments  Date Time Provider Department Center  03/23/2018 11:30 AM [facility] NURSE TECH [facility]  04/22/2018 11:15 AM Cheral Marker, CNM [facility]   Follow up Visit:No follow-ups on file.  Postpartum contraception: Depo Provera  Newborn Data: Live born female  Birth Weight: 8 lb 15.6 oz (4071 g) APGAR: 8, 9  Newborn Delivery   Birth date/time:  03/17/2018  14:34:00 Delivery type:  Vaginal, Spontaneous     Baby Feeding: Bottle and Breast Disposition:home with mother   03/19/2018 Cam Hai, CNM  9:04 AM

## 2018-03-19 NOTE — Discharge Instructions (Signed)
Vaginal Delivery, Care After °Refer to this sheet in the next few weeks. These instructions provide you with information about caring for yourself after vaginal delivery. Your health care provider may also give you more specific instructions. Your treatment has been planned according to current medical practices, but problems sometimes occur. Call your health care provider if you have any problems or questions. °What can I expect after the procedure? °After vaginal delivery, it is common to have: °· Some bleeding from your vagina. °· Soreness in your abdomen, your vagina, and the area of skin between your vaginal opening and your anus (perineum). °· Pelvic cramps. °· Fatigue. ° °Follow these instructions at home: °Medicines °· Take over-the-counter and prescription medicines only as told by your health care provider. °· If you were prescribed an antibiotic medicine, take it as told by your health care provider. Do not stop taking the antibiotic until it is finished. °Driving ° °· Do not drive or operate heavy machinery while taking prescription pain medicine. °· Do not drive for 24 hours if you received a sedative. °Lifestyle °· Do not drink alcohol. This is especially important if you are breastfeeding or taking medicine to relieve pain. °· Do not use tobacco products, including cigarettes, chewing tobacco, or e-cigarettes. If you need help quitting, ask your health care provider. °Eating and drinking °· Drink at least 8 eight-ounce glasses of water every day unless you are told not to by your health care provider. If you choose to breastfeed your baby, you may need to drink more water than this. °· Eat high-fiber foods every day. These foods may help prevent or relieve constipation. High-fiber foods include: °? Whole grain cereals and breads. °? Brown rice. °? Beans. °? Fresh fruits and vegetables. °Activity °· Return to your normal activities as told by your health care provider. Ask your health care provider  what activities are safe for you. °· Rest as much as possible. Try to rest or take a nap when your baby is sleeping. °· Do not lift anything that is heavier than your baby or 10 lb (4.5 kg) until your health care provider says that it is safe. °· Talk with your health care provider about when you can engage in sexual activity. This may depend on your: °? Risk of infection. °? Rate of healing. °? Comfort and desire to engage in sexual activity. °Vaginal Care °· If you have an episiotomy or a vaginal tear, check the area every day for signs of infection. Check for: °? More redness, swelling, or pain. °? More fluid or blood. °? Warmth. °? Pus or a bad smell. °· Do not use tampons or douches until your health care provider says this is safe. °· Watch for any blood clots that may pass from your vagina. These may look like clumps of dark red, brown, or black discharge. °General instructions °· Keep your perineum clean and dry as told by your health care provider. °· Wear loose, comfortable clothing. °· Wipe from front to back when you use the toilet. °· Ask your health care provider if you can shower or take a bath. If you had an episiotomy or a perineal tear during labor and delivery, your health care provider may tell you not to take baths for a certain length of time. °· Wear a bra that supports your breasts and fits you well. °· If possible, have someone help you with household activities and help care for your baby for at least a few days after   you leave the hospital. °· Keep all follow-up visits for you and your baby as told by your health care provider. This is important. °Contact a health care provider if: °· You have: °? Vaginal discharge that has a bad smell. °? Difficulty urinating. °? Pain when urinating. °? A sudden increase or decrease in the frequency of your bowel movements. °? More redness, swelling, or pain around your episiotomy or vaginal tear. °? More fluid or blood coming from your episiotomy or  vaginal tear. °? Pus or a bad smell coming from your episiotomy or vaginal tear. °? A fever. °? A rash. °? Little or no interest in activities you used to enjoy. °? Questions about caring for yourself or your baby. °· Your episiotomy or vaginal tear feels warm to the touch. °· Your episiotomy or vaginal tear is separating or does not appear to be healing. °· Your breasts are painful, hard, or turn red. °· You feel unusually sad or worried. °· You feel nauseous or you vomit. °· You pass large blood clots from your vagina. If you pass a blood clot from your vagina, save it to show to your health care provider. Do not flush blood clots down the toilet without having your health care provider look at them. °· You urinate more than usual. °· You are dizzy or light-headed. °· You have not breastfed at all and you have not had a menstrual period for 12 weeks after delivery. °· You have stopped breastfeeding and you have not had a menstrual period for 12 weeks after you stopped breastfeeding. °Get help right away if: °· You have: °? Pain that does not go away or does not get better with medicine. °? Chest pain. °? Difficulty breathing. °? Blurred vision or spots in your vision. °? Thoughts about hurting yourself or your baby. °· You develop pain in your abdomen or in one of your legs. °· You develop a severe headache. °· You faint. °· You bleed from your vagina so much that you fill two sanitary pads in one hour. °This information is not intended to replace advice given to you by your health care provider. Make sure you discuss any questions you have with your health care provider. °Document Released: 07/31/2000 Document Revised: 01/15/2016 Document Reviewed: 08/18/2015 °Elsevier Interactive Patient Education © 2018 [employer] ° °

## 2018-03-19 NOTE — Lactation Note (Signed)
This note was copied from a baby's chart. Lactation Consultation Note  Patient Name: Boy Emogene MorganLatise Haque ZOXWR'UToday's Date: 03/19/2018 Reason for consult: Follow-up assessment;Infant weight loss;Term;Other (Comment)(boderline engorged / see LC note )  Baby is 46 hours old  Repeat Bili when LC in the room.  Milk in bilaterally , borderline engorged, more on the left than the right.  LC assisted mom to hand express, and on the right milk was flowing well.  The left the areola was tender and mom unable to tolerate the hand expressing. ( per mom tender )  LC instructed mom on the use hand pump , and the #24 was to snug , increased to #27F, better fit.  Mom was comfortable and easily expressed milk from the right, enough to [facility] the baby in the football  Position , depth obtained, multiple swallows, and breast softened down, baby released at 12 mins.  Mom used the hand pump to release the other breast down to comfort.  LC instructed mom on the use hand pump, increased flanges, comfort gels, shells between feedings except when  Sleeping.  Mother informed of post-discharge support and given phone number to the lactation department, including services for phone call assistance; out-patient appointments; and breastfeeding support group. List of other breastfeeding resources in the community given in the handout. Encouraged mother to call for problems or concerns related to breastfeeding.   Maternal Data Has patient been taught Hand Expression?: Yes  Feeding Feeding Type: Breast Fed Length of feed: 12 min  LATCH Score Latch: Grasps breast easily, tongue down, lips flanged, rhythmical sucking.  Audible Swallowing: Spontaneous and intermittent  Type of Nipple: Everted at rest and after stimulation  Comfort (Breast/Nipple): Filling, red/small blisters or bruises, mild/mod discomfort  Hold (Positioning): Assistance needed to correctly position infant at breast and maintain latch.  LATCH Score:  8  Interventions Interventions: Breast feeding basics reviewed;Assisted with latch;Skin to skin;Breast massage;Hand express;Pre-pump if needed;Reverse pressure;Breast compression;Adjust position;Support pillows;Position options;Expressed milk;Shells;Coconut oil;Comfort gels;Hand pump  Lactation Tools Discussed/Used Tools: Shells;Coconut oil;Comfort gels;Flanges Flange Size: 24;27;30;Other (comment)(#27 fits the best / #30 for if momis really full ) Shell Type: Inverted Breast pump type: Manual WIC Program: Yes(per mom ) Pump Review: Setup, frequency, and cleaning;Milk Storage   Consult Status Consult Status: Complete Date: 03/19/18    Matilde SprangMargaret Ann Jashayla Glatfelter 03/19/2018, 1:24 PM

## 2018-03-23 ENCOUNTER — Ambulatory Visit: Payer: Medicaid Other

## 2018-03-23 VITALS — BP 130/86 | HR 92 | Ht 65.2 in | Wt 217.0 lb

## 2018-03-23 DIAGNOSIS — Z0130 Encounter for examination of blood pressure without abnormal findings: Secondary | ICD-10-CM

## 2018-03-23 NOTE — Progress Notes (Signed)
Pt here for blood pressure 130/86. Spoke with Joellyn HaffKim Booker about result.She said blood pressure is okay. Pt wanted something other than Ibuprofen. Advise take tylenol with Ibuprofen. Pt understand. Pad CMA

## 2018-03-31 ENCOUNTER — Encounter: Payer: Self-pay | Admitting: Women's Health

## 2018-04-22 ENCOUNTER — Ambulatory Visit: Payer: Medicaid Other | Admitting: Women's Health

## 2018-04-25 ENCOUNTER — Other Ambulatory Visit: Payer: Self-pay

## 2018-04-25 ENCOUNTER — Telehealth: Payer: Self-pay | Admitting: *Deleted

## 2018-04-25 ENCOUNTER — Encounter: Payer: Self-pay | Admitting: Women's Health

## 2018-04-25 ENCOUNTER — Ambulatory Visit (INDEPENDENT_AMBULATORY_CARE_PROVIDER_SITE_OTHER): Payer: Medicaid Other | Admitting: Women's Health

## 2018-04-25 DIAGNOSIS — Z8632 Personal history of gestational diabetes: Secondary | ICD-10-CM | POA: Diagnosis not present

## 2018-04-25 DIAGNOSIS — O165 Unspecified maternal hypertension, complicating the puerperium: Secondary | ICD-10-CM | POA: Diagnosis not present

## 2018-04-25 DIAGNOSIS — Z30013 Encounter for initial prescription of injectable contraceptive: Secondary | ICD-10-CM

## 2018-04-25 DIAGNOSIS — Z3202 Encounter for pregnancy test, result negative: Secondary | ICD-10-CM

## 2018-04-25 DIAGNOSIS — Z8759 Personal history of other complications of pregnancy, childbirth and the puerperium: Secondary | ICD-10-CM | POA: Insufficient documentation

## 2018-04-25 LAB — POCT URINE PREGNANCY: PREG TEST UR: NEGATIVE

## 2018-04-25 MED ORDER — AMLODIPINE BESYLATE 10 MG PO TABS: 10.0000 mg | ORAL_TABLET | Freq: Every day | ORAL | 0 refills | Status: DC

## 2018-04-25 MED ORDER — MEDROXYPROGESTERONE ACETATE 150 MG/ML IM SUSP: 150.0000 mg | INTRAMUSCULAR | 3 refills | Status: DC

## 2018-04-25 NOTE — Telephone Encounter (Signed)
Patient informed she needs to come 9/16 am for HCG and afternoon for injection.  Reminded patient abstence until then and to bring injection with her to appt.  Verbalized understanding.

## 2018-04-25 NOTE — Progress Notes (Signed)
POSTPARTUM VISIT Patient name: Peggy [name] MRN [id number]  Date of birth: 1986/05/28 Chief Complaint:   Postpartum Care  History of Present Illness:   Peggy [name] is a 32 y.o. G46P2002 African American female being seen today for a postpartum visit. She is 5 weeks postpartum following a spontaneous vaginal delivery at 39.0 gestational weeks after IOL for A2DM. Anesthesia: IV meds I have fully reviewed the prenatal and intrapartum course. Pregnancy complicated by A2DM. ? H/o CHTN dx at new ob visit at [facility] @ 11wks- pt states was off of 1 elevated bp, remained of bp's during pregnancy normal, has never been on bp meds, does have strong family h/o HTN. Denies ha, visual changes, ruq/epigastric pain, n/v.   Postpartum course has been uncomplicated. Bleeding thin lochia. Bowel function is normal. Bladder function is normal.  Patient is sexually active. Last sexual activity: 04/22/18.  Contraception method is none and wants depo.  [place] Postpartum Depression Screening: negative. Score 0.   Last pap 08/1717.  Results were normal .  No LMP recorded.  Baby's course has been uncomplicated. Baby is feeding by breast & bottle.  Review of Systems:   Pertinent items are noted in HPI Denies Abnormal vaginal discharge w/ itching/odor/irritation, headaches, visual changes, shortness of breath, chest pain, abdominal pain, severe nausea/vomiting, or problems with urination or bowel movements. Pertinent History Reviewed:  Reviewed past medical,surgical, obstetrical and family history.  Reviewed problem list, medications and allergies. OB History  Gravida Para Term Preterm AB Living  3 2 2     2   SAB TAB Ectopic Multiple Live Births          2    # Outcome Date GA Lbr Len/2nd Weight Sex Delivery Anes PTL Lv  3 Gravida           2 Term 2009   6 lb 8 oz (2.948 kg)  Vag-Spont   LIV  1 Term 2007   7 lb 8 oz (3.402 kg)  Vag-Spont   LIV   Physical Assessment:   Vitals:   04/25/18 1409  BP: (!)  148/101  Pulse: 75  Weight: 209 lb (94.8 kg)  Height: 5\' 6"  (1.676 m)  Body mass index is 33.73 kg/m.       Physical Examination:   General appearance: alert, well appearing, and in no distress  Mental status: alert, oriented to person, place, and time  Skin: warm & dry   Cardiovascular: normal heart rate noted   Respiratory: normal respiratory effort, no distress   Breasts: deferred, no complaints   Abdomen: soft, non-tender   Pelvic: VULVA: normal appearing vulva with no masses, tenderness or lesions, UTERUS: uterus is normal size, shape, consistency and nontender  Rectal: no hemorrhoids  Extremities: no edema       Results for orders placed or performed in visit on 04/25/18 (from the past 24 hour(s))  POCT urine pregnancy   Collection Time: 04/25/18  2:15 PM  Result Value Ref Range   Preg Test, Ur Negative Negative    Assessment & Plan:  1) Postpartum exam 2) 5 wks s/p SVB after IOL for A2DM 3) Breast & bottlefeeding 4) Depression screening 5) Contraception counseling, pt prefers abstinence until depo  6) PPHTN> ?h/o CHTN, rx norvasc, stop 2d before visit in 4wks 7) A2DM during pregnancy> will get 2hr GTT 12wks pp  Meds:  Meds ordered this encounter  Medications  . amLODipine (NORVASC) 10 MG tablet    Sig: Take 1 tablet (10  mg total) by mouth daily.    Dispense:  30 tablet    Refill:  0    Order Specific Question:   Supervising Provider    Answer:   Despina Hidden, LUTHER H [[id number]]  . medroxyPROGESTERone (DEPO-PROVERA) 150 MG/ML injection    Sig: Inject 1 mL (150 mg total) into the muscle every 3 (three) months.    Dispense:  1 mL    Refill:  3    Order Specific Question:   Supervising Provider    Answer:   Lazaro Arms [[id number]]    Follow-up: Return for 9/16 am bhcg, pm depo, then 4wks from now for bp check w/ provider; then 7wks from now for sugar tes.   Orders Placed This Encounter  Procedures  . POCT urine pregnancy    Cheral Marker CNM,  [facility] 04/25/2018 2:43 PM

## 2018-04-25 NOTE — Patient Instructions (Signed)
NO SEX UNTIL AFTER YOU GET YOUR BIRTH CONTROL   Stop taking the blood pressure medicine 2 days before your visit 4 weeks from now  Medroxyprogesterone injection [Contraceptive] What is this medicine? MEDROXYPROGESTERONE (me DROX ee proe JES te rone) contraceptive injections prevent pregnancy. They provide effective birth control for 3 months. Depo-subQ Provera 104 is also used for treating pain related to endometriosis. This medicine may be used for other purposes; ask your health care provider or pharmacist if you have questions. COMMON BRAND NAME(S): Depo-Provera, Depo-subQ Provera 104 What should I tell my health care provider before I take this medicine? They need to know if you have any of these conditions: -frequently drink alcohol -asthma -blood vessel disease or a history of a blood clot in the lungs or legs -bone disease such as osteoporosis -breast cancer -diabetes -eating disorder (anorexia nervosa or bulimia) -high blood pressure -HIV infection or AIDS -kidney disease -liver disease -mental depression -migraine -seizures (convulsions) -stroke -tobacco smoker -vaginal bleeding -an unusual or allergic reaction to medroxyprogesterone, other hormones, medicines, foods, dyes, or preservatives -pregnant or trying to get pregnant -breast-feeding How should I use this medicine? Depo-Provera Contraceptive injection is given into a muscle. Depo-subQ Provera 104 injection is given under the skin. These injections are given by a health care professional. You must not be pregnant before getting an injection. The injection is usually given during the first 5 days after the start of a menstrual period or 6 weeks after delivery of a baby. Talk to your pediatrician regarding the use of this medicine in children. Special care may be needed. These injections have been used in female children who have started having menstrual periods. Overdosage: If you think you have taken too much of this  medicine contact a poison control center or emergency room at once. NOTE: This medicine is only for you. Do not share this medicine with others. What if I miss a dose? Try not to miss a dose. You must get an injection once every 3 months to maintain birth control. If you cannot keep an appointment, call and reschedule it. If you wait longer than 13 weeks between Depo-Provera contraceptive injections or longer than 14 weeks between Depo-subQ Provera 104 injections, you could get pregnant. Use another method for birth control if you miss your appointment. You may also need a pregnancy test before receiving another injection. What may interact with this medicine? Do not take this medicine with any of the following medications: -bosentan This medicine may also interact with the following medications: -aminoglutethimide -antibiotics or medicines for infections, especially rifampin, rifabutin, rifapentine, and griseofulvin -aprepitant -barbiturate medicines such as phenobarbital or primidone -bexarotene -carbamazepine -medicines for seizures like ethotoin, felbamate, oxcarbazepine, phenytoin, topiramate -modafinil -St. John's wort This list may not describe all possible interactions. Give your health care provider a list of all the medicines, herbs, non-prescription drugs, or dietary supplements you use. Also tell them if you smoke, drink alcohol, or use illegal drugs. Some items may interact with your medicine. What should I watch for while using this medicine? This drug does not protect you against HIV infection (AIDS) or other sexually transmitted diseases. Use of this product may cause you to lose calcium from your bones. Loss of calcium may cause weak bones (osteoporosis). Only use this product for more than 2 years if other forms of birth control are not right for you. The longer you use this product for birth control the more likely you will be at risk for weak bones.  Ask your health care  professional how you can keep strong bones. You may have a change in bleeding pattern or irregular periods. Many females stop having periods while taking this drug. If you have received your injections on time, your chance of being pregnant is very low. If you think you may be pregnant, see your health care professional as soon as possible. Tell your health care professional if you want to get pregnant within the next year. The effect of this medicine may last a long time after you get your last injection. What side effects may I notice from receiving this medicine? Side effects that you should report to your doctor or health care professional as soon as possible: -allergic reactions like skin rash, itching or hives, swelling of the face, lips, or tongue -breast tenderness or discharge -breathing problems -changes in vision -depression -feeling faint or lightheaded, falls -fever -pain in the abdomen, chest, groin, or leg -problems with balance, talking, walking -unusually weak or tired -yellowing of the eyes or skin Side effects that usually do not require medical attention (report to your doctor or health care professional if they continue or are bothersome): -acne -fluid retention and swelling -headache -irregular periods, spotting, or absent periods -temporary pain, itching, or skin reaction at site where injected -weight gain This list may not describe all possible side effects. Call your doctor for medical advice about side effects. You may report side effects to FDA at 1-800-FDA-1088. Where should I keep my medicine? This does not apply. The injection will be given to you by a health care professional. NOTE: This sheet is a summary. It may not cover all possible information. If you have questions about this medicine, talk to your doctor, pharmacist, or health care provider.  2018 Elsevier/Gold Standard (2008-08-24 18:37:56)

## 2018-05-02 ENCOUNTER — Ambulatory Visit: Payer: Medicaid Other

## 2018-05-02 ENCOUNTER — Other Ambulatory Visit: Payer: Self-pay | Admitting: Women's Health

## 2018-05-02 ENCOUNTER — Other Ambulatory Visit: Payer: Medicaid Other

## 2018-05-02 DIAGNOSIS — Z3042 Encounter for surveillance of injectable contraceptive: Secondary | ICD-10-CM

## 2018-05-03 ENCOUNTER — Ambulatory Visit: Payer: Medicaid Other

## 2018-05-03 LAB — BETA HCG QUANT (REF LAB): hCG Quant: <1 m[IU]/mL

## 2018-05-04 ENCOUNTER — Encounter: Payer: Self-pay | Admitting: *Deleted

## 2018-05-04 ENCOUNTER — Other Ambulatory Visit: Payer: Self-pay

## 2018-05-04 ENCOUNTER — Ambulatory Visit (INDEPENDENT_AMBULATORY_CARE_PROVIDER_SITE_OTHER): Payer: Medicaid Other | Admitting: *Deleted

## 2018-05-04 DIAGNOSIS — Z3042 Encounter for surveillance of injectable contraceptive: Secondary | ICD-10-CM

## 2018-05-04 MED ORDER — MEDROXYPROGESTERONE ACETATE 150 MG/ML IM SUSP
150.0000 mg | Freq: Once | INTRAMUSCULAR | Status: AC
  Administered 2018-05-04: 150 mg via INTRAMUSCULAR

## 2018-05-04 NOTE — Progress Notes (Signed)
Pt given DepoProvera 150mg IM right deltoid without complications. Advised to return in 12 weeks for next injection. 

## 2018-05-23 ENCOUNTER — Encounter: Payer: Medicaid Other | Admitting: Obstetrics & Gynecology

## 2018-05-24 ENCOUNTER — Encounter: Payer: Medicaid Other | Admitting: Obstetrics & Gynecology

## 2018-05-30 ENCOUNTER — Encounter: Payer: Medicaid Other | Admitting: Obstetrics & Gynecology

## 2018-06-13 ENCOUNTER — Other Ambulatory Visit: Payer: Medicaid Other

## 2018-06-15 ENCOUNTER — Other Ambulatory Visit: Payer: Medicaid Other

## 2018-07-19 ENCOUNTER — Ambulatory Visit (INDEPENDENT_AMBULATORY_CARE_PROVIDER_SITE_OTHER): Payer: 59 | Admitting: Advanced Practice Midwife

## 2018-07-19 ENCOUNTER — Encounter: Payer: Self-pay | Admitting: Advanced Practice Midwife

## 2018-07-19 VITALS — BP 130/87 | HR 78 | Ht 66.0 in | Wt 219.0 lb

## 2018-07-19 DIAGNOSIS — L910 Hypertrophic scar: Secondary | ICD-10-CM

## 2018-07-19 NOTE — Progress Notes (Signed)
[facility] Visit  Patient name: Peggy SoloLatise S Lynde MRN [id number]  Date of birth: 10/12/1985  CC & HPI:  Peggy [name] is a 32 y.o. African American female presenting today for 2 spots on breast . One has been there "a long time" but the other has just developed.  Doesn't remember having a bump or anything there. Hasn't breastfed in a month or so  Pertinent History Reviewed:  Medical & Surgical Hx:   Past Medical History:  Diagnosis Date  . Gestational diabetes    metformin  . Medical history non-contributory   . Pregnancy induced hypertension    Past Surgical History:  Procedure Laterality Date  . COLPOSCOPY     Family History  Problem Relation Age of Onset  . Hypertension Mother   . Asthma Daughter   . Asthma Son   . Diabetes Maternal Grandmother     Current Outpatient Medications:  .  medroxyPROGESTERone (DEPO-PROVERA) 150 MG/ML injection, Inject 1 mL (150 mg total) into the muscle every 3 (three) months., Disp: 1 mL, Rfl: 3 .  amLODipine (NORVASC) 10 MG tablet, Take 1 tablet (10 mg total) by mouth daily. (Patient not taking: Reported on 07/19/2018), Disp: 30 tablet, Rfl: 0 .  ibuprofen (ADVIL,MOTRIN) 600 MG tablet, Take 1 tablet (600 mg total) by mouth every 6 (six) hours as needed. (Patient not taking: Reported on 07/19/2018), Disp: 30 tablet, Rfl: 0 .  Prenatal Vit-Fe Fumarate-FA (PREPLUS) 27-1 MG TABS, Take 1 tablet by mouth daily. (Patient not taking: Reported on 04/25/2018), Disp: 30 tablet, Rfl: 13 Social History: Reviewed -  reports that she has been smoking cigarettes. She has been smoking about 0.25 packs per day. She has never used smokeless tobacco.  Review of Systems:   Constitutional: Negative for fever and chills Eyes: Negative for visual disturbances Respiratory: Negative for shortness of breath, dyspnea Cardiovascular: Negative for chest pain or palpitations  Gastrointestinal: Negative for vomiting, diarrhea and constipation; no abdominal  pain Genitourinary: Negative for dysuria and urgency, vaginal irritation or itching Musculoskeletal: Negative for back pain, joint pain, myalgias  Neurological: Negative for dizziness and headaches    Objective Findings:    Physical Examination: Vitals:   07/19/18 1405  BP: 130/87  Pulse: 78   General appearance - well appearing, and in no distress Mental status - alert, oriented to person, place, and time Chest:  Normal respiratory effort Heart - normal rate and regular rhythm Breast:  Left breast 2 keloids.  Co exam w/LHE and he agrees Musculoskeletal:  Normal range of motion without pain Extremities:  No edema    No results found for this or any previous visit (from the past 24 hour(s)).    Assessment & Plan:  A:   keloid P:  No tx    Return for If you have any problems.  Scarlette CalicoFrances Cresenzo-Dishmon CNM 07/19/2018 2:18 PM

## 2018-07-27 ENCOUNTER — Ambulatory Visit: Payer: 59

## 2018-07-28 ENCOUNTER — Encounter: Payer: Self-pay | Admitting: *Deleted

## 2018-07-28 ENCOUNTER — Ambulatory Visit (INDEPENDENT_AMBULATORY_CARE_PROVIDER_SITE_OTHER): Payer: 59 | Admitting: *Deleted

## 2018-07-28 DIAGNOSIS — Z308 Encounter for other contraceptive management: Secondary | ICD-10-CM

## 2018-07-28 DIAGNOSIS — Z3202 Encounter for pregnancy test, result negative: Secondary | ICD-10-CM | POA: Diagnosis not present

## 2018-07-28 DIAGNOSIS — Z3042 Encounter for surveillance of injectable contraceptive: Secondary | ICD-10-CM | POA: Diagnosis not present

## 2018-07-28 LAB — POCT URINE PREGNANCY: PREG TEST UR: NEGATIVE

## 2018-07-28 MED ORDER — MEDROXYPROGESTERONE ACETATE 150 MG/ML IM SUSP
150.0000 mg | Freq: Once | INTRAMUSCULAR | Status: AC
  Administered 2018-07-28: 150 mg via INTRAMUSCULAR

## 2018-07-28 NOTE — Progress Notes (Signed)
Pt here for Depo. Pt received shot in left deltoid. Pt tolerated shot well. Return in 12 weeks for next shot. JSY 

## 2018-10-20 ENCOUNTER — Ambulatory Visit: Payer: Self-pay

## 2018-10-24 ENCOUNTER — Ambulatory Visit: Payer: Self-pay

## 2018-12-06 ENCOUNTER — Telehealth: Payer: Self-pay | Admitting: Advanced Practice Midwife

## 2018-12-06 NOTE — Telephone Encounter (Signed)
Pt states that she is late getting her depo shot and would like to see if she can come in and have this injection.

## 2018-12-07 ENCOUNTER — Telehealth: Payer: Self-pay | Admitting: *Deleted

## 2018-12-07 NOTE — Telephone Encounter (Signed)
Patient has missed Depo injection.  Last period was about 1 week ago and had intercourse last night.  Advised she will need HCG on 5/5 and no sex even with condom until then.  Pt verbalized understanding.

## 2018-12-07 NOTE — Telephone Encounter (Signed)
LMOVM returning patient's call.  

## 2018-12-20 ENCOUNTER — Other Ambulatory Visit: Payer: Medicaid Other

## 2018-12-20 ENCOUNTER — Ambulatory Visit (INDEPENDENT_AMBULATORY_CARE_PROVIDER_SITE_OTHER): Payer: Medicaid Other | Admitting: *Deleted

## 2018-12-20 ENCOUNTER — Other Ambulatory Visit: Payer: Self-pay

## 2018-12-20 DIAGNOSIS — Z3042 Encounter for surveillance of injectable contraceptive: Secondary | ICD-10-CM

## 2018-12-20 LAB — BETA HCG QUANT (REF LAB): hCG Quant: <1 m[IU]/mL

## 2018-12-20 MED ORDER — MEDROXYPROGESTERONE ACETATE 150 MG/ML IM SUSP
150.0000 mg | Freq: Once | INTRAMUSCULAR | Status: AC
  Administered 2018-12-20: 150 mg via INTRAMUSCULAR

## 2018-12-20 NOTE — Progress Notes (Signed)
Depo Provera 150mg IM given in right deltoid with no complications. Pt to return in 12 weeks for next injection.  

## 2019-01-20 NOTE — Progress Notes (Signed)
Patient was a no show for this appt.  Nurse precharted.

## 2019-03-14 ENCOUNTER — Ambulatory Visit: Payer: Medicaid Other

## 2019-07-27 IMAGING — US US OB COMP LESS 14 WK
1 series · 15 of 28 positions shown · non-contrast
Comparison: None.

CLINICAL DATA: Vaginal bleeding and cramping. Gestational age by
LMP of 6 weeks 0 days.

EXAM:
OBSTETRIC <14 WK US AND TRANSVAGINAL OB US
TECHNIQUE: Both transabdominal and transvaginal ultrasound examinations were
performed for complete evaluation of the gestation as well as the
maternal uterus, adnexal regions, and pelvic cul-de-sac.
Transvaginal technique was performed to assess early pregnancy.

[Series 1: us ob comp less 14 wk · 15 of 59 slices shown]
[im 1/59]
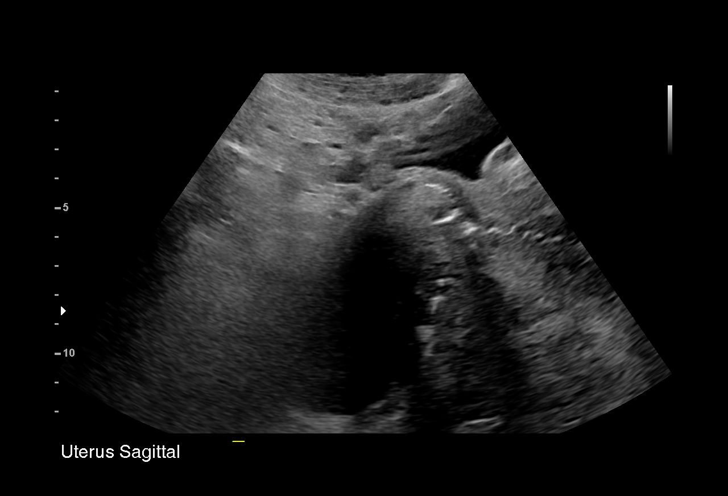
[im 5/59]
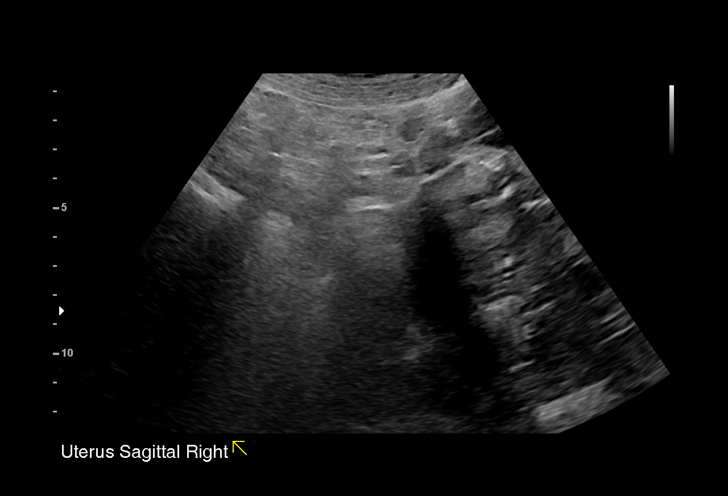
[im 9/59]
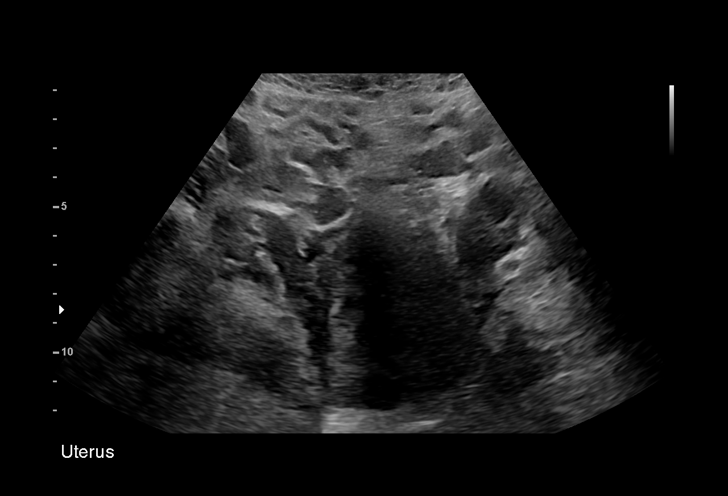
[im 13/59]
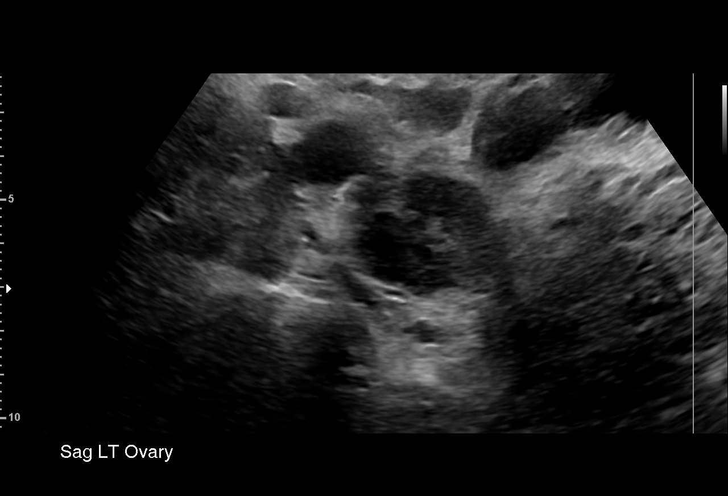
[im 18/59]
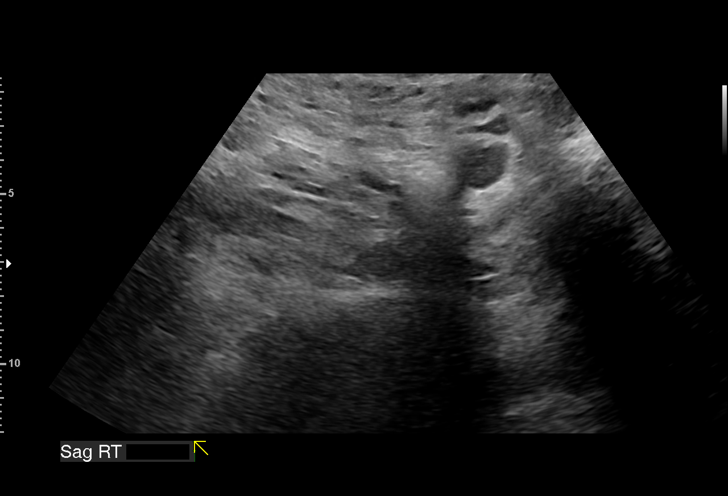
[im 22/59]
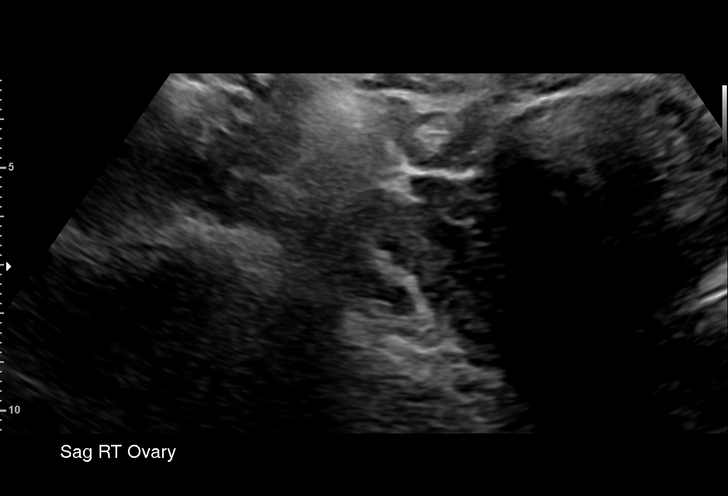
[im 26/59]
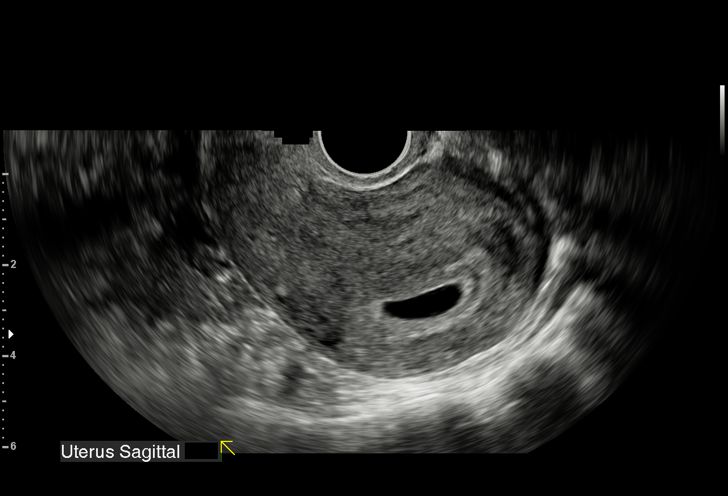
[im 31/59]
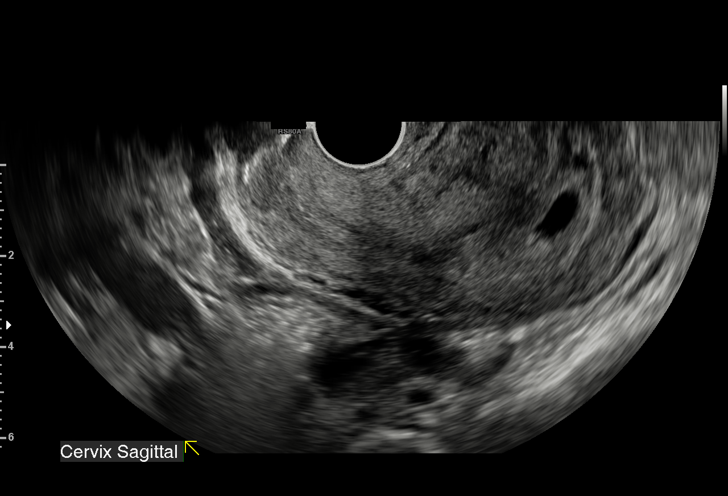
[im 33/59]
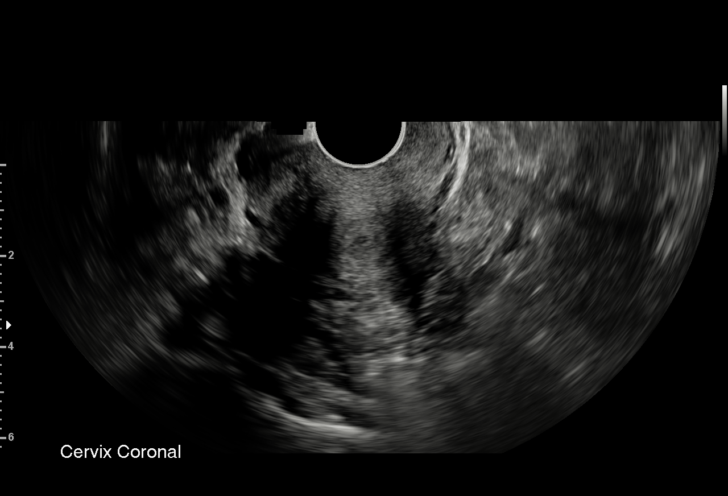
[im 37/59]
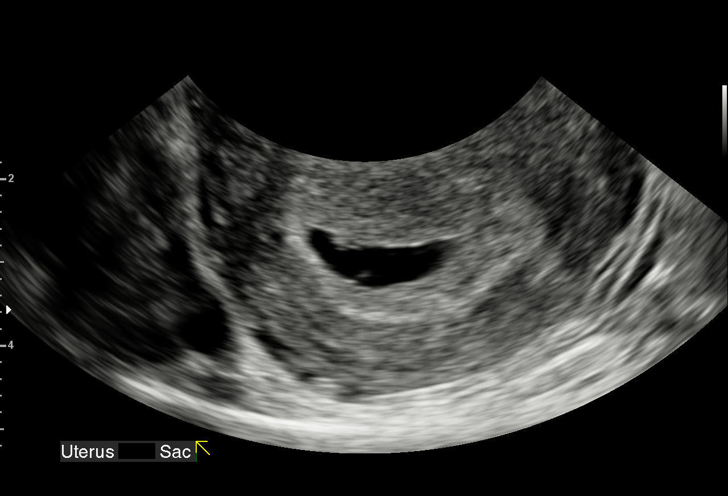
[im 41/59]
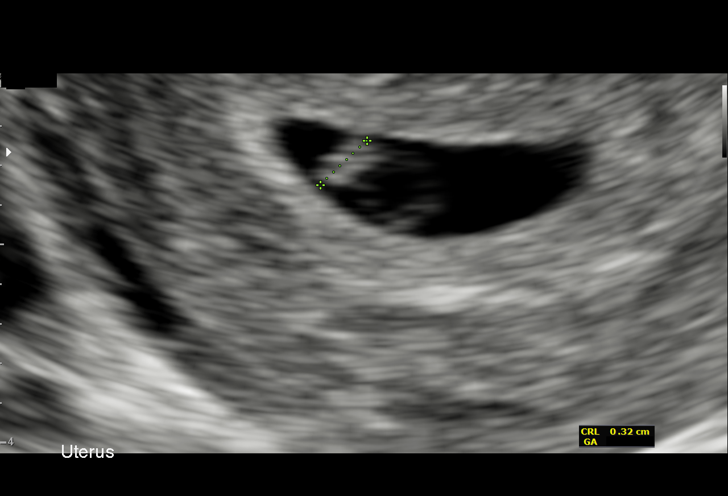
[im 46/59]
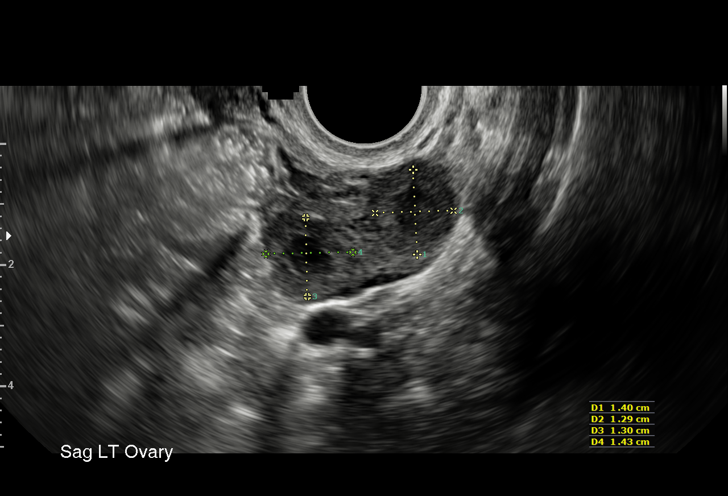
[im 50/59]
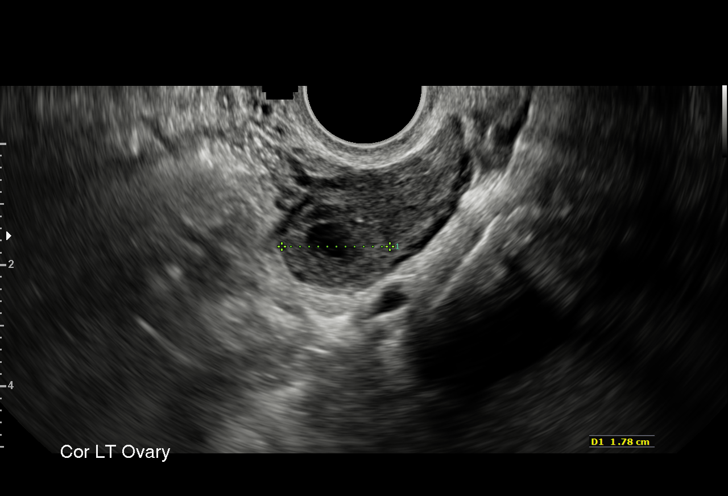
[im 54/59]
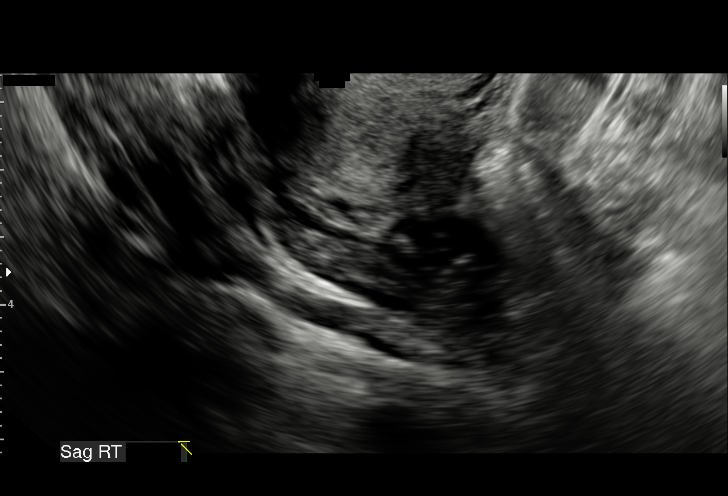
[im 59/59]
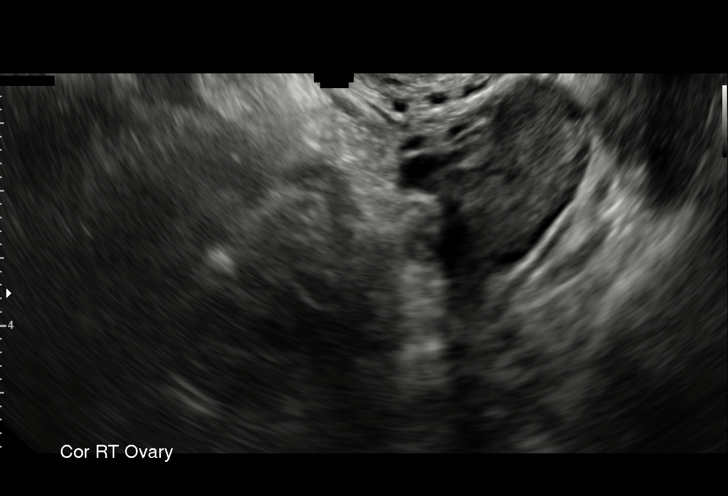

[15 of 28 positions shown; findings below may reference images not displayed]

FINDINGS: Intrauterine gestational sac: Single

Yolk sac:  Visualized.

Embryo:  Visualized.

Cardiac Activity: Visualized.

Heart Rate: 107  bpm

CRL:  3  mm   5 w   6 d                  US EDC: 03/25/2018

Subchorionic hemorrhage:  None visualized.

Maternal uterus/adnexae: Small left ovarian corpus luteum noted.
Normal appearance of left ovary. No adnexal mass or abnormal free
fluid identified.
IMPRESSION: Single living IUP measuring 5 weeks 6 days, which is concordant with
LMP.

No significant maternal uterine or adnexal abnormality identified.

## 2019-08-02 ENCOUNTER — Ambulatory Visit (INDEPENDENT_AMBULATORY_CARE_PROVIDER_SITE_OTHER): Payer: Medicaid Other | Admitting: *Deleted

## 2019-08-02 ENCOUNTER — Other Ambulatory Visit: Payer: Self-pay | Admitting: Obstetrics & Gynecology

## 2019-08-02 ENCOUNTER — Other Ambulatory Visit: Payer: Self-pay

## 2019-08-02 VITALS — BP 121/83 | HR 83

## 2019-08-02 DIAGNOSIS — O3680X0 Pregnancy with inconclusive fetal viability, not applicable or unspecified: Secondary | ICD-10-CM

## 2019-08-02 DIAGNOSIS — N926 Irregular menstruation, unspecified: Secondary | ICD-10-CM

## 2019-08-02 DIAGNOSIS — Z3201 Encounter for pregnancy test, result positive: Secondary | ICD-10-CM

## 2019-08-02 LAB — POCT URINE PREGNANCY: Preg Test, Ur: POSITIVE — AB

## 2019-08-02 MED ORDER — PRENATAL PLUS 27-1 MG PO TABS: 1.0000 | ORAL_TABLET | Freq: Every day | ORAL | 12 refills | Status: DC

## 2019-08-02 MED ORDER — PROMETHAZINE HCL 25 MG PO TABS: 25.0000 mg | ORAL_TABLET | Freq: Four times a day (QID) | ORAL | 1 refills | Status: DC | PRN

## 2019-08-02 NOTE — Progress Notes (Signed)
   NURSE VISIT- PREGNANCY CONFIRMATION   SUBJECTIVE:  Peggy [name] is a 33 y.o. G22P3003 female at [redacted]w[redacted]d by uncertain LMP of Patient's last menstrual period was 06/05/2019 (within days). Here for pregnancy confirmation.  Home pregnancy test: positive x 2  She reports nausea and vomiting.  She is not taking prenatal vitamins.    OBJECTIVE:  LMP 06/05/2019 (Within Days)   Appears well, in no apparent distress OB History  Gravida Para Term Preterm AB Living  4 3 3     3   SAB TAB Ectopic Multiple Live Births          3    # Outcome Date GA Lbr Len/2nd Weight Sex Delivery Anes PTL Lv  4 Current           3 Term 2009   6 lb 8 oz (2.948 kg)  Vag-Spont   LIV  2 Term 2007   7 lb 8 oz (3.402 kg)  Vag-Spont   LIV  1 Term    8 lb 15 oz (4.054 kg) M Vag-Spont   LIV    Results for orders placed or performed in visit on 08/02/19 (from the past 24 hour(s))  POCT urine pregnancy   Collection Time: 08/02/19 10:40 AM  Result Value Ref Range   Preg Test, Ur Positive (A) Negative    ASSESSMENT: Positive Pregnancy Test, [redacted]w[redacted]d by uncertain lmp    PLAN: Schedule for dating ultrasound in next available  Prenatal vitamins: note routed to Derrek Monaco to send prescription   Nausea medicines: requested-note routed to Derrek Monaco to send prescription   OB packet given: Yes  Jayden Kratochvil, Celene Squibb  08/02/2019 10:47 AM

## 2019-08-02 NOTE — Progress Notes (Signed)
Chart reviewed for nurse visit. Agree with plan of care. Will rx prenatal plus and phenergan  Estill Dooms, NP 08/02/2019 12:14 PM

## 2019-08-02 NOTE — Addendum Note (Signed)
Addended by: Derrek Monaco A on: 08/02/2019 12:14 PM   Modules accepted: Orders

## 2019-08-04 ENCOUNTER — Ambulatory Visit (INDEPENDENT_AMBULATORY_CARE_PROVIDER_SITE_OTHER): Payer: Medicaid Other

## 2019-08-04 ENCOUNTER — Other Ambulatory Visit: Payer: Self-pay

## 2019-08-04 DIAGNOSIS — Z3A08 8 weeks gestation of pregnancy: Secondary | ICD-10-CM

## 2019-08-04 DIAGNOSIS — O3680X0 Pregnancy with inconclusive fetal viability, not applicable or unspecified: Secondary | ICD-10-CM | POA: Diagnosis not present

## 2019-08-04 NOTE — Progress Notes (Signed)
[place] 8+4 wks,single IUP with YS,FHR 167 BPM,normal ovaries,CRL 25.21 mm

## 2019-08-18 NOTE — L&D Delivery Note (Signed)
[facility] Delivery Note  Peggy [name] is a 34 y.o. [id number] s/p VD at [redacted]w[redacted]d. She was admitted for early labor and augmented for gHTN.   ROM: 6h 31m with clear fluid GBS Status: Negative/-- (07/01 1430) Maximum Maternal Temperature: 98.20F  Labor Progress: . Initial SVE: 3.5/70/-2. Patient received AROM and Cytotec. She then progressed to complete.   Delivery Date/Time: 7/13 @ 1811 Delivery: Called to room and patient was complete and pushing. Head delivered LOA and tight nuchal reduced. Shoulder and body delivered in usual fashion. Infant with spontaneous cry, placed on mother's abdomen, dried and stimulated. Cord clamped x 2 after 1-minute delay, and cut by FOB. Cord blood drawn. Placenta did not deliver spontaneously with gentle cord traction despite waiting 20+ minutes and was manually extracted after IV Fentanyl given. Fundus firm with massage and Pitocin. Labia, perineum, vagina, and cervix inspected inspected with no lacerations.  Baby Weight: pending  Placenta: Sent to L&D Complications: Manual extraction of retained placenta Lacerations: None EBL: 225 mL Analgesia: None   Infant:  APGAR (1 MIN): 8   APGAR (5 MINS): 9   APGAR (10 MINS):     Jerilynn Birkenhead, MD [facility] Family Medicine Fellow, [facility] for [facility], [facility] 02/27/2020, 7:02 PM

## 2019-08-25 ENCOUNTER — Other Ambulatory Visit: Payer: Medicaid Other

## 2019-08-25 ENCOUNTER — Encounter: Payer: Medicaid Other | Admitting: Obstetrics & Gynecology

## 2019-08-25 ENCOUNTER — Ambulatory Visit: Payer: Medicaid Other | Admitting: *Deleted

## 2019-09-01 ENCOUNTER — Other Ambulatory Visit: Payer: Self-pay | Admitting: Obstetrics and Gynecology

## 2019-09-01 DIAGNOSIS — Z3682 Encounter for antenatal screening for nuchal translucency: Secondary | ICD-10-CM

## 2019-09-04 ENCOUNTER — Ambulatory Visit (INDEPENDENT_AMBULATORY_CARE_PROVIDER_SITE_OTHER): Payer: Medicaid Other

## 2019-09-04 ENCOUNTER — Ambulatory Visit (INDEPENDENT_AMBULATORY_CARE_PROVIDER_SITE_OTHER): Payer: Medicaid Other | Admitting: Advanced Practice Midwife

## 2019-09-04 ENCOUNTER — Other Ambulatory Visit: Payer: Self-pay

## 2019-09-04 ENCOUNTER — Encounter: Payer: Self-pay | Admitting: Advanced Practice Midwife

## 2019-09-04 VITALS — BP 122/78 | HR 78 | Wt 222.0 lb

## 2019-09-04 DIAGNOSIS — Z0283 Encounter for blood-alcohol and blood-drug test: Secondary | ICD-10-CM

## 2019-09-04 DIAGNOSIS — Z1389 Encounter for screening for other disorder: Secondary | ICD-10-CM

## 2019-09-04 DIAGNOSIS — Z3A13 13 weeks gestation of pregnancy: Secondary | ICD-10-CM

## 2019-09-04 DIAGNOSIS — Z8759 Personal history of other complications of pregnancy, childbirth and the puerperium: Secondary | ICD-10-CM

## 2019-09-04 DIAGNOSIS — O0990 Supervision of high risk pregnancy, unspecified, unspecified trimester: Secondary | ICD-10-CM

## 2019-09-04 DIAGNOSIS — O09521 Supervision of elderly multigravida, first trimester: Secondary | ICD-10-CM | POA: Diagnosis not present

## 2019-09-04 DIAGNOSIS — Z331 Pregnant state, incidental: Secondary | ICD-10-CM | POA: Diagnosis not present

## 2019-09-04 DIAGNOSIS — Z113 Encounter for screening for infections with a predominantly sexual mode of transmission: Secondary | ICD-10-CM

## 2019-09-04 DIAGNOSIS — Z3682 Encounter for antenatal screening for nuchal translucency: Secondary | ICD-10-CM | POA: Diagnosis not present

## 2019-09-04 DIAGNOSIS — Z31430 Encounter of female for testing for genetic disease carrier status for procreative management: Secondary | ICD-10-CM

## 2019-09-04 DIAGNOSIS — Z8632 Personal history of gestational diabetes: Secondary | ICD-10-CM

## 2019-09-04 DIAGNOSIS — Z3490 Encounter for supervision of normal pregnancy, unspecified, unspecified trimester: Secondary | ICD-10-CM | POA: Insufficient documentation

## 2019-09-04 DIAGNOSIS — Z3481 Encounter for supervision of other normal pregnancy, first trimester: Secondary | ICD-10-CM

## 2019-09-04 DIAGNOSIS — O09529 Supervision of elderly multigravida, unspecified trimester: Secondary | ICD-10-CM | POA: Insufficient documentation

## 2019-09-04 MED ORDER — PANTOPRAZOLE SODIUM 20 MG PO TBEC: 20.0000 mg | DELAYED_RELEASE_TABLET | Freq: Every day | ORAL | 3 refills | Status: DC

## 2019-09-04 MED ORDER — BLOOD PRESSURE MONITOR MISC: 0 refills | Status: AC

## 2019-09-04 NOTE — Progress Notes (Signed)
INITIAL OBSTETRICAL VISIT Patient name: Peggy [name] MRN [id number]  Date of birth: 03-Apr-1986 Chief Complaint:   Initial Prenatal Visit (nt/it)  History of Present Illness:   Peggy [name] is a 34 y.o. G36P3003 African American female at [redacted]w[redacted]d by LMP c/w 8wk scan with an Estimated Date of Delivery: 03/11/20 being seen today for her initial obstetrical visit.   Her obstetrical history is significant for advanced maternal age and hx GDMA2 & gHTN with 3rd preg.   Today she reports heartburn and nausea.  Patient's last menstrual period was 06/05/2019 (within days). Last pap Jan 2019. Results were: normal Review of Systems:   Pertinent items are noted in HPI Denies cramping/contractions, leakage of fluid, vaginal bleeding, abnormal vaginal discharge w/ itching/odor/irritation, headaches, visual changes, shortness of breath, chest pain, abdominal pain, severe nausea/vomiting, or problems with urination or bowel movements unless otherwise stated above.  Pertinent History Reviewed:  Reviewed past medical,surgical, social, obstetrical and family history.  Reviewed problem list, medications and allergies. OB History  Gravida Para Term Preterm AB Living  4 3 3     3   SAB TAB Ectopic Multiple Live Births          3    # Outcome Date GA Lbr Len/2nd Weight Sex Delivery Anes PTL Lv  4 Current           3 Term 03/17/18 [redacted]w[redacted]d  8 lb 15 oz (4.054 kg) M Vag-Spont Other N LIV     Complications: Gestational diabetes  2 Term 10/04/07 [redacted]w[redacted]d  6 lb 8 oz (2.948 kg) F Vag-Spont   LIV  1 Term 03/14/06 [redacted]w[redacted]d  7 lb 8 oz (3.402 kg) M Vag-Spont   LIV   Physical Assessment:   Vitals:   09/04/19 0946  BP: 122/78  Pulse: 78  Weight: 222 lb (100.7 kg)  Body mass index is 35.83 kg/m.       Physical Examination:  General appearance - well appearing, and in no distress  Mental status - alert, oriented to person, place, and time  Psych:  She has a normal mood and affect  Skin - warm and dry, normal color,  no suspicious lesions noted  Chest - effort normal, all lung fields clear to auscultation bilaterally  Heart - normal rate and regular rhythm  Abdomen - soft, nontender  Extremities:  No swelling or varicosities noted  Pelvic - not indicated  Thin prep pap is not done  TODAY'S NT 09/06/19 13 wks,measurements c/w dates,crl 83 mm,NB present,NT 1.6 mm,normal ovaries,fhr 157 bpm  No results found for this or any previous visit (from the past 24 hour(s)).  Assessment & Plan:  1) Low-Risk Pregnancy G4P3003 at [redacted]w[redacted]d with an Estimated Date of Delivery: 03/11/20   2) Initial OB visit  3) Hx GDM,  HbA1c with NOB labs  4) gHTN vs cHTN, nl BP today; allergic to ASA (throat swelling), keep watching BPs  Meds:  Meds ordered this encounter  Medications  . Blood Pressure Monitor MISC    Sig: For regular home bp monitoring during pregnancy    Dispense:  1 each    Refill:  0    Z34.90  . pantoprazole (PROTONIX) 20 MG tablet    Sig: Take 1 tablet (20 mg total) by mouth daily.    Dispense:  30 tablet    Refill:  3    Initial labs obtained Continue prenatal vitamins Reviewed n/v relief measures and warning s/s to report Reviewed recommended weight gain based on  pre-gravid BMI Encouraged well-balanced diet Genetic Screening discussed: requested Cystic fibrosis, SMA, Fragile X screening discussed declined Ultrasound discussed; fetal survey: requested CCNC completed>PCM not here, form faxed The nature of [place] for [employer] with multiple MDs and other Advanced Practice Providers was explained to patient; also emphasized that fellows, residents, and students are part of our team. Getting home bp cuff.  Check bp weekly, let us know if >140/90.   Indications for ASA therapy (per uptodate)- would qualify to take, but has a severe allergy to ASA.  Indications for early 1 hour GTT (per uptodate)- HbA1c with NOB labs  BMI >=25 (>=23 in Asian women) AND one of the following GDM in  a previous pregnancy Yes High-risk race/ethnicity (eg, African American, Latino, Native American, [place] American, [place]) Yes  Follow-up: Return in about 5 weeks (around 10/09/2019) for [place], in person, [place]: Anatomy, 2nd IT.   Orders Placed This Encounter  Procedures  . GC/Chlamydia Probe Amp  . Urine Culture  . Integrated 1  . Obstetric Panel, Including HIV  . MaterniT 21 plus Core, Blood  . Pain Management Screening Profile (10S)  . HgB A1c  . POC Urinalysis Dipstick OB    Myrtis Ser [facility] 09/04/2019 1:09 PM

## 2019-09-04 NOTE — Patient Instructions (Signed)
Peggy [name], I greatly value your feedback.  If you receive a survey following your visit with [place] today, we appreciate you taking the time to fill it out.  Thanks, Philipp Deputy, CNM  [facility] HAS MOVED!!! It is now [facility] ([address] [place], [place] [address]) Entrance located off of [address] Free 24/7 valet parking   Nausea & Vomiting  Have saltine crackers or pretzels by your bed and eat a few bites before you raise your head out of bed in the morning  Eat small frequent meals throughout the day instead of large meals  Drink plenty of fluids throughout the day to stay hydrated, just don't drink a lot of fluids with your meals.  This can make your stomach fill up faster making you feel sick  Do not brush your teeth right after you eat  Products with real ginger are good for nausea, like ginger ale and ginger hard candy Make sure it says made with real ginger!  Sucking on sour candy like lemon heads is also good for nausea  If your prenatal vitamins make you nauseated, take them at night so you will sleep through the nausea  Sea Bands  If you feel like you need medicine for the nausea & vomiting please let us know  If you are unable to keep any fluids or food down please let us know   Constipation  Drink plenty of fluid, preferably water, throughout the day  Eat foods high in fiber such as fruits, vegetables, and grains  Exercise, such as walking, is a good way to keep your bowels regular  Drink warm fluids, especially warm prune juice, or decaf coffee  Eat a 1/2 cup of real oatmeal (not instant), 1/2 cup applesauce, and 1/2-1 cup warm prune juice every day  If needed, you may take Colace (docusate sodium) stool softener once or twice a day to help keep the stool soft.   If you still are having problems with constipation, you may take Miralax once daily as needed to help keep your bowels regular.   Home Blood Pressure  Monitoring for Patients   Your provider has recommended that you check your blood pressure (BP) at least once a week at home. If you do not have a blood pressure cuff at home, one will be provided for you. Contact your provider if you have not received your monitor within 1 week.   Helpful Tips for Accurate Home Blood Pressure Checks  . Don't smoke, exercise, or drink caffeine 30 minutes before checking your BP . Use the restroom before checking your BP (a full bladder can raise your pressure) . Relax in a comfortable upright chair . Feet on the ground . Left arm resting comfortably on a flat surface at the level of your heart . Legs uncrossed . Back supported . Sit quietly and don't talk . Place the cuff on your bare arm . Adjust snuggly, so that only two fingertips can fit between your skin and the top of the cuff . Check 2 readings separated by at least one minute . Keep a log of your BP readings . For a visual, please reference this diagram: http://ccnc.care/bpdiagram  Provider Name: [facility]     Phone: [phone]  Zone 1: ALL CLEAR  Continue to monitor your symptoms:  . BP reading is less than 140 (top number) or less than 90 (bottom number)  . No right upper stomach pain .  No headaches or seeing spots . No feeling nauseated or throwing up . No swelling in face and hands  Zone 2: CAUTION Call your doctor's office for any of the following:  . BP reading is greater than 140 (top number) or greater than 90 (bottom number)  . Stomach pain under your ribs in the middle or right side . Headaches or seeing spots . Feeling nauseated or throwing up . Swelling in face and hands  Zone 3: EMERGENCY  Seek immediate medical care if you have any of the following:  . BP reading is greater than160 (top number) or greater than 110 (bottom number) . Severe headaches not improving with Tylenol . Serious difficulty catching your breath . Any worsening symptoms from Zone 2     First Trimester of Pregnancy The first trimester of pregnancy is from week 1 until the end of week 12 (months 1 through 3). A week after a sperm fertilizes an egg, the egg will implant on the wall of the uterus. This embryo will begin to develop into a baby. Genes from you and your partner are forming the baby. The female genes determine whether the baby is a boy or a girl. At 6-8 weeks, the eyes and face are formed, and the heartbeat can be seen on ultrasound. At the end of 12 weeks, all the baby's organs are formed.  Now that you are pregnant, you will want to do everything you can to have a healthy baby. Two of the most important things are to get good prenatal care and to follow your health care provider's instructions. Prenatal care is all the medical care you receive before the baby's birth. This care will help prevent, find, and treat any problems during the pregnancy and childbirth. BODY CHANGES Your body goes through many changes during pregnancy. The changes vary from woman to woman.   You may gain or lose a couple of pounds at first.  You may feel sick to your stomach (nauseous) and throw up (vomit). If the vomiting is uncontrollable, call your health care provider.  You may tire easily.  You may develop headaches that can be relieved by medicines approved by your health care provider.  You may urinate more often. Painful urination may mean you have a bladder infection.  You may develop heartburn as a result of your pregnancy.  You may develop constipation because certain hormones are causing the muscles that push waste through your intestines to slow down.  You may develop hemorrhoids or swollen, bulging veins (varicose veins).  Your breasts may begin to grow larger and become tender. Your nipples may stick out more, and the tissue that surrounds them (areola) may become darker.  Your gums may bleed and may be sensitive to brushing and flossing.  Dark spots or blotches (chloasma,  mask of pregnancy) may develop on your face. This will likely fade after the baby is born.  Your menstrual periods will stop.  You may have a loss of appetite.  You may develop cravings for certain kinds of food.  You may have changes in your emotions from day to day, such as being excited to be pregnant or being concerned that something may go wrong with the pregnancy and baby.  You may have more vivid and strange dreams.  You may have changes in your hair. These can include thickening of your hair, rapid growth, and changes in texture. Some women also have hair loss during or after pregnancy, or hair that feels dry  or thin. Your hair will most likely return to normal after your baby is born. WHAT TO EXPECT AT YOUR PRENATAL VISITS During a routine prenatal visit:  You will be weighed to make sure you and the baby are growing normally.  Your blood pressure will be taken.  Your abdomen will be measured to track your baby's growth.  The fetal heartbeat will be listened to starting around week 10 or 12 of your pregnancy.  Test results from any previous visits will be discussed. Your health care provider may ask you:  How you are feeling.  If you are feeling the baby move.  If you have had any abnormal symptoms, such as leaking fluid, bleeding, severe headaches, or abdominal cramping.  If you have any questions. Other tests that may be performed during your first trimester include:  Blood tests to find your blood type and to check for the presence of any previous infections. They will also be used to check for low iron levels (anemia) and Rh antibodies. Later in the pregnancy, blood tests for diabetes will be done along with other tests if problems develop.  Urine tests to check for infections, diabetes, or protein in the urine.  An ultrasound to confirm the proper growth and development of the baby.  An amniocentesis to check for possible genetic problems.  Fetal screens for  spina bifida and Down syndrome.  You may need other tests to make sure you and the baby are doing well. HOME CARE INSTRUCTIONS  Medicines  Follow your health care provider's instructions regarding medicine use. Specific medicines may be either safe or unsafe to take during pregnancy.  Take your prenatal vitamins as directed.  If you develop constipation, try taking a stool softener if your health care provider approves. Diet  Eat regular, well-balanced meals. Choose a variety of foods, such as meat or vegetable-based protein, fish, milk and low-fat dairy products, vegetables, fruits, and whole grain breads and cereals. Your health care provider will help you determine the amount of weight gain that is right for you.  Avoid raw meat and uncooked cheese. These carry germs that can cause birth defects in the baby.  Eating four or five small meals rather than three large meals a day may help relieve nausea and vomiting. If you start to feel nauseous, eating a few soda crackers can be helpful. Drinking liquids between meals instead of during meals also seems to help nausea and vomiting.  If you develop constipation, eat more high-fiber foods, such as fresh vegetables or fruit and whole grains. Drink enough fluids to keep your urine clear or pale yellow. Activity and Exercise  Exercise only as directed by your health care provider. Exercising will help you:  Control your weight.  Stay in shape.  Be prepared for labor and delivery.  Experiencing pain or cramping in the lower abdomen or low back is a good sign that you should stop exercising. Check with your health care provider before continuing normal exercises.  Try to avoid standing for long periods of time. Move your legs often if you must stand in one place for a long time.  Avoid heavy lifting.  Wear low-heeled shoes, and practice good posture.  You may continue to have sex unless your health care provider directs you  otherwise. Relief of Pain or Discomfort  Wear a good support bra for breast tenderness.    Take warm sitz baths to soothe any pain or discomfort caused by hemorrhoids. Use hemorrhoid cream if your  health care provider approves.    Rest with your legs elevated if you have leg cramps or low back pain.  If you develop varicose veins in your legs, wear support hose. Elevate your feet for 15 minutes, 3-4 times a day. Limit salt in your diet. Prenatal Care  Schedule your prenatal visits by the twelfth week of pregnancy. They are usually scheduled monthly at first, then more often in the last 2 months before delivery.  Write down your questions. Take them to your prenatal visits.  Keep all your prenatal visits as directed by your health care provider. Safety  Wear your seat belt at all times when driving.  Make a list of emergency phone numbers, including numbers for family, friends, the hospital, and police and fire departments. General Tips  Ask your health care provider for a referral to a local prenatal education class. Begin classes no later than at the beginning of month 6 of your pregnancy.  Ask for help if you have counseling or nutritional needs during pregnancy. Your health care provider can offer advice or refer you to specialists for help with various needs.  Do not use hot tubs, steam rooms, or saunas.  Do not douche or use tampons or scented sanitary pads.  Do not cross your legs for long periods of time.  Avoid cat litter boxes and soil used by cats. These carry germs that can cause birth defects in the baby and possibly loss of the fetus by miscarriage or stillbirth.  Avoid all smoking, herbs, alcohol, and medicines not prescribed by your health care provider. Chemicals in these affect the formation and growth of the baby.  Schedule a dentist appointment. At home, brush your teeth with a soft toothbrush and be gentle when you floss. SEEK MEDICAL CARE IF:   You have  dizziness.  You have mild pelvic cramps, pelvic pressure, or nagging pain in the abdominal area.  You have persistent nausea, vomiting, or diarrhea.  You have a bad smelling vaginal discharge.  You have pain with urination.  You notice increased swelling in your face, hands, legs, or ankles. SEEK IMMEDIATE MEDICAL CARE IF:   You have a fever.  You are leaking fluid from your vagina.  You have spotting or bleeding from your vagina.  You have severe abdominal cramping or pain.  You have rapid weight gain or loss.  You vomit blood or material that looks like coffee grounds.  You are exposed to [place] measles and have never had them.  You are exposed to fifth disease or chickenpox.  You develop a severe headache.  You have shortness of breath.  You have any kind of trauma, such as from a fall or a car accident. Document Released: 07/28/2001 Document Revised: 12/18/2013 Document Reviewed: 06/13/2013 [facility] Patient Information 2015 [place], [place]. This information is not intended to replace advice given to you by your health care provider. Make sure you discuss any questions you have with your health care provider.  Coronavirus (COVID-19) Are you at risk?  Are you at risk for the Coronavirus (COVID-19)?  To be considered HIGH RISK for Coronavirus (COVID-19), you have to meet the following criteria:  . Traveled to [place], [place], [place], [place] or [place]; or in the [place] to [place], [place], [place], or [place]; and have fever, cough, and shortness of breath within the last 2 weeks of travel OR . Been in close contact with a person diagnosed with COVID-19 within the last 2 weeks and  have fever, cough, and shortness of breath . IF YOU DO NOT MEET THESE CRITERIA, YOU ARE CONSIDERED LOW RISK FOR COVID-19.  What to do if you are HIGH RISK for COVID-19?  Marland Kitchen If you are having a medical emergency, call 911. . Seek medical care right away. Before you go to a  doctor's office, urgent care or emergency department, call ahead and tell them about your recent travel, contact with someone diagnosed with COVID-19, and your symptoms. You should receive instructions from your physician's office regarding next steps of care.  . When you arrive at healthcare provider, tell the healthcare staff immediately you have returned from visiting [place], [place], [place], [place] or [place]; or traveled in the [place] to [place], [place], [place], or [place]; in the last two weeks or you have been in close contact with a person diagnosed with COVID-19 in the last 2 weeks.   . Tell the health care staff about your symptoms: fever, cough and shortness of breath. . After you have been seen by a medical provider, you will be either: o Tested for (COVID-19) and discharged home on quarantine except to seek medical care if symptoms worsen, and asked to  - Stay home and avoid contact with others until you get your results (4-5 days)  - Avoid travel on public transportation if possible (such as bus, train, or airplane) or o Sent to the Emergency Department by EMS for evaluation, COVID-19 testing, and possible admission depending on your condition and test results.  What to do if you are LOW RISK for COVID-19?  Reduce your risk of any infection by using the same precautions used for avoiding the common cold or flu:  Marland Kitchen Wash your hands often with soap and warm water for at least 20 seconds.  If soap and water are not readily available, use an alcohol-based hand sanitizer with at least 60% alcohol.  . If coughing or sneezing, cover your mouth and nose by coughing or sneezing into the elbow areas of your shirt or coat, into a tissue or into your sleeve (not your hands). . Avoid shaking hands with others and consider head nods or verbal greetings only. . Avoid touching your eyes, nose, or mouth with unwashed hands.  . Avoid close contact with people who are sick. . Avoid  places or events with large numbers of people in one location, like concerts or sporting events. . Carefully consider travel plans you have or are making. . If you are planning any travel outside or inside the [place], visit the CDC's Travelers' Health webpage for the latest health notices. . If you have some symptoms but not all symptoms, continue to monitor at home and seek medical attention if your symptoms worsen. . If you are having a medical emergency, call 911.   [place] / e-Visit: [email or web address]         [facility]: [facility]: [id number]                   [facility]: [phone]

## 2019-09-04 NOTE — Progress Notes (Signed)
[place] 13 wks,measurements c/w dates,crl 83 mm,NB present,NT 1.6 mm,normal ovaries,fhr 157 bpm

## 2019-09-05 ENCOUNTER — Encounter: Payer: Self-pay | Admitting: Advanced Practice Midwife

## 2019-09-05 DIAGNOSIS — F1490 Cocaine use, unspecified, uncomplicated: Secondary | ICD-10-CM | POA: Insufficient documentation

## 2019-09-05 DIAGNOSIS — O99320 Drug use complicating pregnancy, unspecified trimester: Secondary | ICD-10-CM | POA: Insufficient documentation

## 2019-09-05 LAB — PMP SCREEN PROFILE (10S), URINE
Amphetamine Scrn, Ur: NEGATIVE ng/mL
BARBITURATE SCREEN URINE: NEGATIVE ng/mL
BENZODIAZEPINE SCREEN, URINE: NEGATIVE ng/mL
CANNABINOIDS UR QL SCN: POSITIVE ng/mL — AB
Cocaine (Metab) Scrn, Ur: POSITIVE ng/mL — AB
Creatinine(Crt), U: 128.5 mg/dL (ref 20.0–300.0)
Methadone Screen, Urine: NEGATIVE ng/mL
OXYCODONE+OXYMORPHONE UR QL SCN: NEGATIVE ng/mL
Opiate Scrn, Ur: NEGATIVE ng/mL
Ph of Urine: 7.4 (ref 4.5–8.9)
Phencyclidine Qn, Ur: NEGATIVE ng/mL
Propoxyphene Scrn, Ur: NEGATIVE ng/mL

## 2019-09-05 LAB — GC/CHLAMYDIA PROBE AMP
Chlamydia trachomatis, NAA: NEGATIVE
Neisseria Gonorrhoeae by PCR: NEGATIVE

## 2019-09-05 LAB — MED LIST OPTION NOT SELECTED

## 2019-09-06 LAB — URINE CULTURE

## 2019-09-09 LAB — OBSTETRIC PANEL, INCLUDING HIV
Antibody Screen: NEGATIVE
Basophils Absolute: 0.1 10*3/uL (ref 0.0–0.2)
Basos: 1 %
EOS (ABSOLUTE): 0.6 10*3/uL — ABNORMAL HIGH (ref 0.0–0.4)
Eos: 7 %
HIV Screen 4th Generation wRfx: NONREACTIVE
Hematocrit: 41.9 % (ref 34.0–46.6)
Hemoglobin: 14.1 g/dL (ref 11.1–15.9)
Hepatitis B Surface Ag: NEGATIVE
Immature Grans (Abs): 0 10*3/uL (ref 0.0–0.1)
Immature Granulocytes: 0 %
Lymphocytes Absolute: 2.3 10*3/uL (ref 0.7–3.1)
Lymphs: 24 %
MCH: 27.9 pg (ref 26.6–33.0)
MCHC: 33.7 g/dL (ref 31.5–35.7)
MCV: 83 fL (ref 79–97)
Monocytes Absolute: 0.6 10*3/uL (ref 0.1–0.9)
Monocytes: 7 %
Neutrophils Absolute: 5.9 10*3/uL (ref 1.4–7.0)
Neutrophils: 61 %
Platelets: 192 10*3/uL (ref 150–450)
RBC: 5.05 x10E6/uL (ref 3.77–5.28)
RDW: 13.4 % (ref 11.7–15.4)
RPR Ser Ql: NONREACTIVE
Rh Factor: POSITIVE
Rubella Antibodies, IGG: 7.83 index (ref >0.99)
WBC: 9.6 10*3/uL (ref 3.4–10.8)

## 2019-09-09 LAB — INTEGRATED 1
Crown Rump Length: 83 mm
Gest. Age on Collection Date: 13.9 weeks
Maternal Age at EDD: 35.5 yr
Nuchal Translucency (NT): 1.6 mm
Number of Fetuses: 1
PAPP-A Value: 3979 ng/mL
Weight: 227 [lb_av]

## 2019-09-09 LAB — MATERNIT 21 PLUS CORE, BLOOD
Fetal Fraction: 9
Result (T21): NEGATIVE
Trisomy 13 (Patau syndrome): NEGATIVE
Trisomy 18 (Edwards syndrome): NEGATIVE
Trisomy 21 (Down syndrome): NEGATIVE

## 2019-09-11 DIAGNOSIS — Z331 Pregnant state, incidental: Secondary | ICD-10-CM | POA: Diagnosis not present

## 2019-09-11 DIAGNOSIS — Z3481 Encounter for supervision of other normal pregnancy, first trimester: Secondary | ICD-10-CM | POA: Diagnosis not present

## 2019-10-04 ENCOUNTER — Other Ambulatory Visit: Payer: Self-pay | Admitting: Advanced Practice Midwife

## 2019-10-04 DIAGNOSIS — Z363 Encounter for antenatal screening for malformations: Secondary | ICD-10-CM

## 2019-10-09 ENCOUNTER — Encounter: Payer: Medicaid Other | Admitting: Advanced Practice Midwife

## 2019-10-09 ENCOUNTER — Other Ambulatory Visit: Payer: Medicaid Other

## 2019-10-23 ENCOUNTER — Other Ambulatory Visit: Payer: Self-pay

## 2019-10-23 ENCOUNTER — Ambulatory Visit (INDEPENDENT_AMBULATORY_CARE_PROVIDER_SITE_OTHER): Payer: Medicaid Other | Admitting: Advanced Practice Midwife

## 2019-10-23 ENCOUNTER — Ambulatory Visit (INDEPENDENT_AMBULATORY_CARE_PROVIDER_SITE_OTHER): Payer: Medicaid Other

## 2019-10-23 VITALS — BP 118/86 | HR 90 | Wt 224.0 lb

## 2019-10-23 DIAGNOSIS — Z3481 Encounter for supervision of other normal pregnancy, first trimester: Secondary | ICD-10-CM | POA: Diagnosis not present

## 2019-10-23 DIAGNOSIS — F1490 Cocaine use, unspecified, uncomplicated: Secondary | ICD-10-CM

## 2019-10-23 DIAGNOSIS — Z331 Pregnant state, incidental: Secondary | ICD-10-CM

## 2019-10-23 DIAGNOSIS — Z363 Encounter for antenatal screening for malformations: Secondary | ICD-10-CM

## 2019-10-23 DIAGNOSIS — O09522 Supervision of elderly multigravida, second trimester: Secondary | ICD-10-CM

## 2019-10-23 DIAGNOSIS — Z3A20 20 weeks gestation of pregnancy: Secondary | ICD-10-CM

## 2019-10-23 DIAGNOSIS — Z3402 Encounter for supervision of normal first pregnancy, second trimester: Secondary | ICD-10-CM

## 2019-10-23 DIAGNOSIS — Z1379 Encounter for other screening for genetic and chromosomal anomalies: Secondary | ICD-10-CM

## 2019-10-23 DIAGNOSIS — O99320 Drug use complicating pregnancy, unspecified trimester: Secondary | ICD-10-CM

## 2019-10-23 DIAGNOSIS — Z31430 Encounter of female for testing for genetic disease carrier status for procreative management: Secondary | ICD-10-CM

## 2019-10-23 DIAGNOSIS — Z3482 Encounter for supervision of other normal pregnancy, second trimester: Secondary | ICD-10-CM

## 2019-10-23 DIAGNOSIS — Z1389 Encounter for screening for other disorder: Secondary | ICD-10-CM

## 2019-10-23 MED ORDER — PRENATE MINI 18-0.6-0.4-350 MG PO CAPS: 1.0000 | ORAL_CAPSULE | Freq: Every day | ORAL | 11 refills | Status: DC

## 2019-10-23 NOTE — Patient Instructions (Signed)
Peggy [name], I greatly value your feedback.  If you receive a survey following your visit with [place] today, we appreciate you taking the time to fill it out.  Thanks, [name], DNP, CNM  [facility] HAS MOVED!!! It is now [facility] ([address] [place], [place] [address]) Entrance located off of [address] Free 24/7 valet parking   Go to [email or web address] to register for FREE online childbirth classes    Call the office [phone]) or go to [facility] if:  You begin to have strong, frequent contractions  Your water breaks.  Sometimes it is a big gush of fluid, sometimes it is just a trickle that keeps getting your panties wet or running down your legs  You have vaginal bleeding.  It is normal to have a small amount of spotting if your cervix was checked.   You don't feel your baby moving like normal.  If you don't, get you something to eat and drink and lay down and focus on feeling your baby move.  You should feel at least 10 movements in 2 hours.  If you don't, you should call the office or go to [facility].   Home Blood Pressure Monitoring for Patients   Your provider has recommended that you check your blood pressure (BP) at least once a week at home. If you do not have a blood pressure cuff at home, one will be provided for you. Contact your provider if you have not received your monitor within 1 week.   Helpful Tips for Accurate Home Blood Pressure Checks  . Don't smoke, exercise, or drink caffeine 30 minutes before checking your BP . Use the restroom before checking your BP (a full bladder can raise your pressure) . Relax in a comfortable upright chair . Feet on the ground . Left arm resting comfortably on a flat surface at the level of your heart . Legs uncrossed . Back supported . Sit quietly and don't talk . Place the cuff on your bare arm . Adjust snuggly, so that only two fingertips can fit  between your skin and the top of the cuff . Check 2 readings separated by at least one minute . Keep a log of your BP readings . For a visual, please reference this diagram: http://ccnc.care/bpdiagram  Provider Name: [facility]     Phone: [phone]  Zone 1: ALL CLEAR  Continue to monitor your symptoms:  . BP reading is less than 140 (top number) or less than 90 (bottom number)  . No right upper stomach pain . No headaches or seeing spots . No feeling nauseated or throwing up . No swelling in face and hands  Zone 2: CAUTION Call your doctor's office for any of the following:  . BP reading is greater than 140 (top number) or greater than 90 (bottom number)  . Stomach pain under your ribs in the middle or right side . Headaches or seeing spots . Feeling nauseated or throwing up . Swelling in face and hands  Zone 3: EMERGENCY  Seek immediate medical care if you have any of the following:  . BP reading is greater than160 (top number) or greater than 110 (bottom number) . Severe headaches not improving with Tylenol . Serious difficulty catching your breath . Any worsening symptoms from Zone 2

## 2019-10-23 NOTE — Progress Notes (Signed)
[place] 20 wks,cephalic,cx 4 cm,fhr 152 bpm,svp of fluid 5 cm,posterior placenta gr 0,normal ovaries,EFW 415 g 97%,anatomy complete

## 2019-10-23 NOTE — Progress Notes (Signed)
   LOW-RISK PREGNANCY VISIT Patient name: Peggy [name] MRN [id number]  Date of birth: 03-Nov-1985 Chief Complaint:   No chief complaint on file.  History of Present Illness:   Peggy [name] is a 34 y.o. [phone] female at [redacted]w[redacted]d with an Estimated Date of Delivery: 03/11/20 being seen today for ongoing management of a low-risk pregnancy.  Today she reports no complaints.  .  .   . denies leaking of fluid. Review of Systems:   Pertinent items are noted in HPI Denies abnormal vaginal discharge w/ itching/odor/irritation, headaches, visual changes, shortness of breath, chest pain, abdominal pain, severe nausea/vomiting, or problems with urination or bowel movements unless otherwise stated above. Pertinent History Reviewed:  Reviewed past medical,surgical, social, obstetrical and family history.  Reviewed problem list, medications and allergies. Physical Assessment:  There were no vitals filed for this visit.There is no height or weight on file to calculate BMI.        Physical Examination:   General appearance: Well appearing, and in no distress  Mental status: Alert, oriented to person, place, and time  Skin: Warm & dry  Cardiovascular: Normal heart rate noted  Respiratory: Normal respiratory effort, no distress  Abdomen: Soft, gravid, nontender  Pelvic: Cervical exam deferred         Extremities:    Fetal Status:       [place] 20 wks,cephalic,cx 4 cm,fhr 152 bpm,svp of fluid 5 cm,posterior placenta gr 0,normal ovaries,EFW 415 g 97%,anatomy complete Chaperone: n/a    No results found for this or any previous visit (from the past 24 hour(s)).  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 Dangers discussed (PTD, abruption, fetal death)   Meds: No orders of the defined types were placed in this encounter.  Labs/procedures today: 2nd IT, A1c  Plan:  Continue routine obstetrical care  Next  visit: prefers online    Reviewed: Preterm labor symptoms and general obstetric precautions including but not limited to vaginal bleeding, contractions, leaking of fluid and fetal movement were reviewed in detail with the patient.  All questions were answered. Has home bp cuff. Check bp weekly, let us know if >140/90.   Follow-up: Return in about 4 weeks (around 11/20/2019) for [facility] Mychart visit.  No orders of the defined types were placed in this encounter.  Jacklyn Shell DNP, CNM 10/23/2019 10:06 AM

## 2019-10-24 LAB — HEMOGLOBIN A1C
Est. average glucose Bld gHb Est-mCnc: 111 mg/dL
Hgb A1c MFr Bld: 5.5 % (ref 4.8–5.6)

## 2019-10-25 LAB — INTEGRATED 2
AFP MoM: 1.25
Alpha-Fetoprotein: 60.3 ng/mL
Crown Rump Length: 83 mm
DIA MoM: 0.6
DIA Value: 103.6 pg/mL
Estriol, Unconjugated: 2.61 ng/mL
Gest. Age on Collection Date: 13.9 weeks
Gestational Age: 20.9 weeks
Maternal Age at EDD: 35.5 yr
Nuchal Translucency (NT): 1.6 mm
Nuchal Translucency MoM: 0.81
Number of Fetuses: 1
PAPP-A MoM: 4.41
PAPP-A Value: 3979 ng/mL
Test Results:: NEGATIVE
Weight: 224 [lb_av]
Weight: 227 [lb_av]
hCG MoM: 0.62
hCG Value: 9.9 IU/mL
uE3 MoM: 1.09

## 2019-10-31 IMAGING — US US MFM OB DETAIL+14 WK
1 series · 13 of 28 positions shown · non-contrast
Comparison: none

[Series 1: us mfm ob detail+14 wk · 13 of 62 slices shown]
[im 3/62]
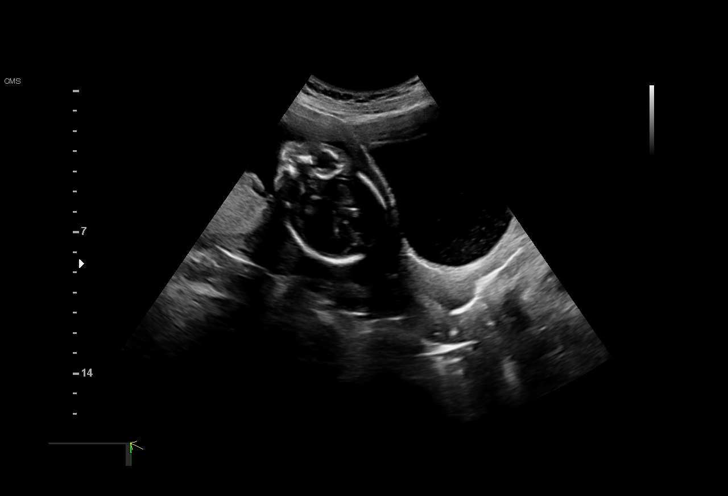
[im 7/62]
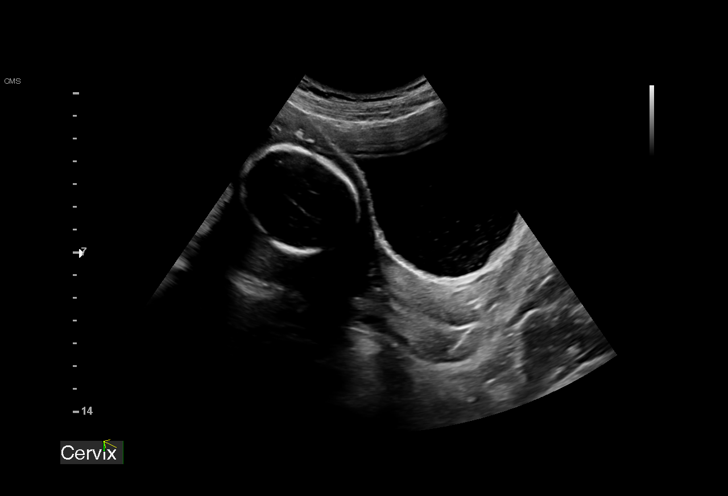
[im 12/62]
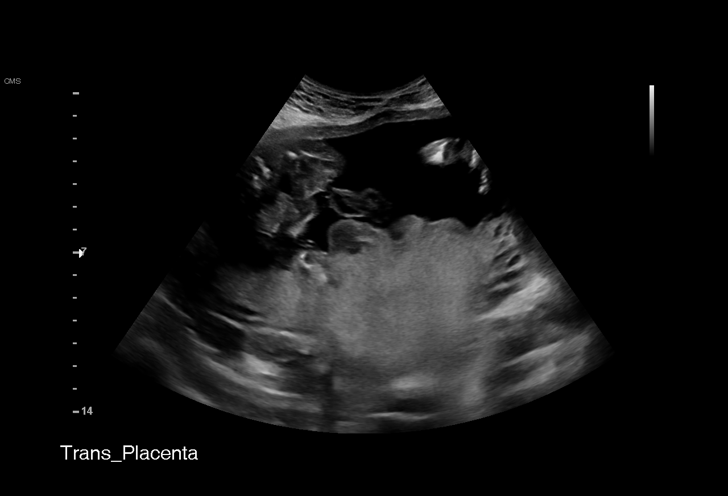
[im 16/62]
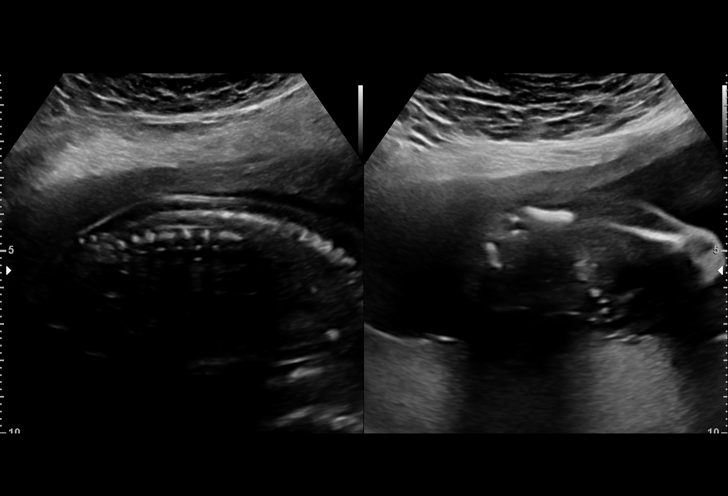
[im 21/62]
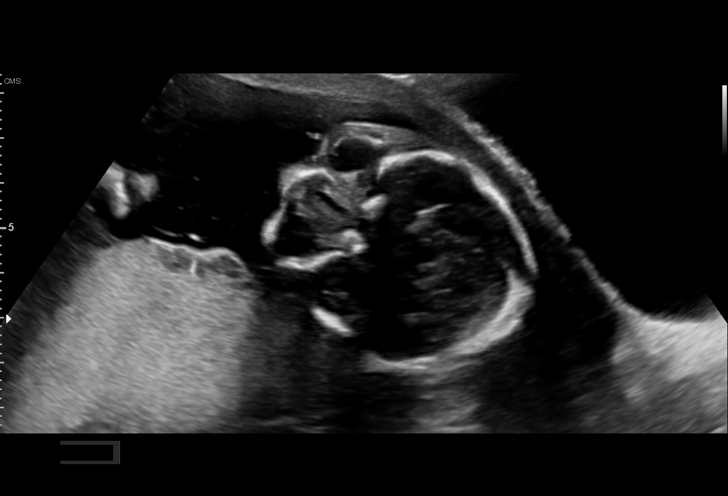
[im 25/62]
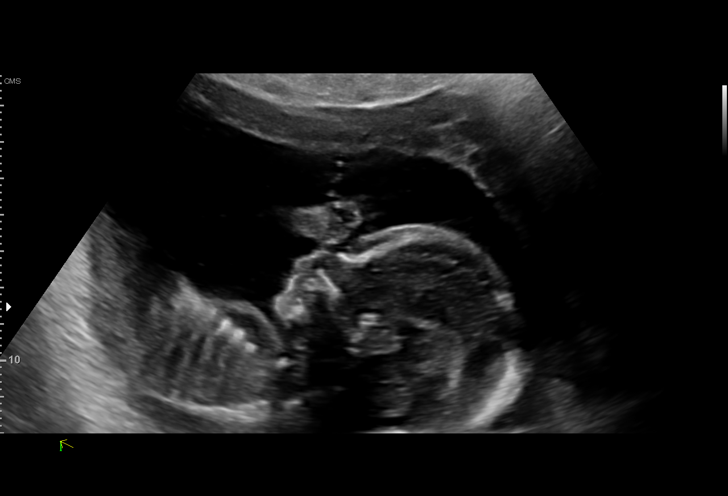
[im 32/62]
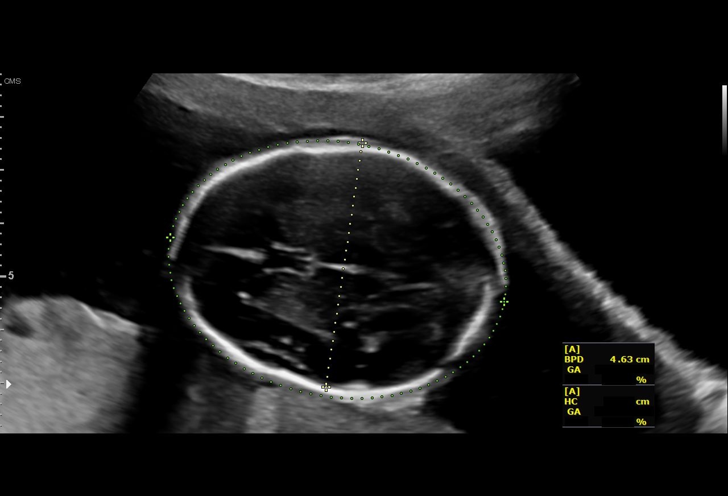
[im 37/62]
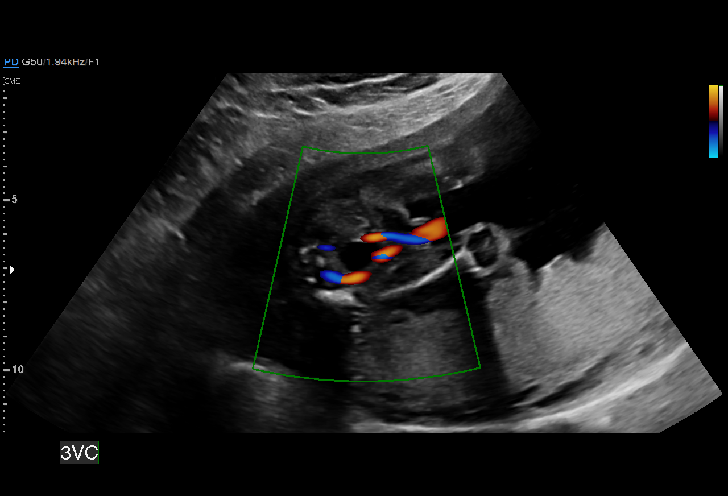
[im 41/62]
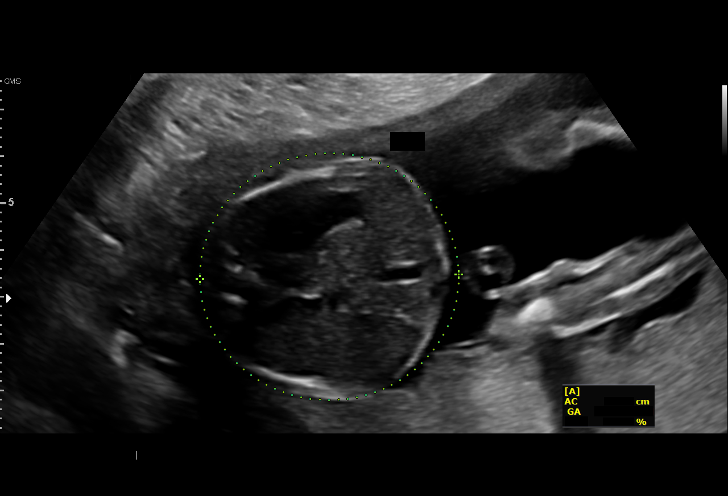
[im 46/62]
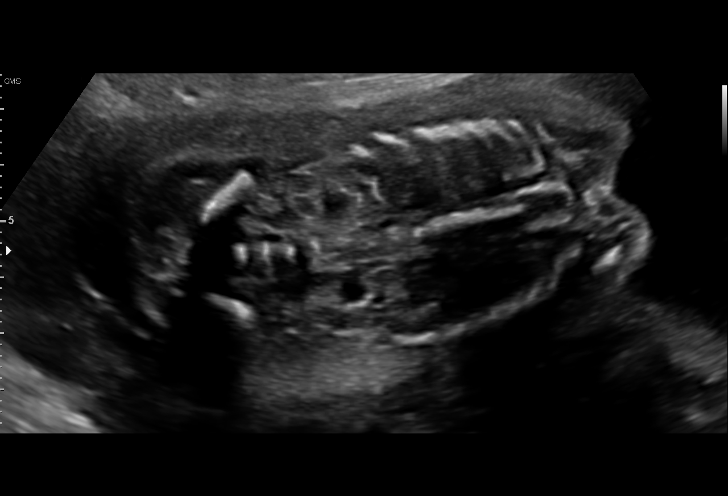
[im 50/62]
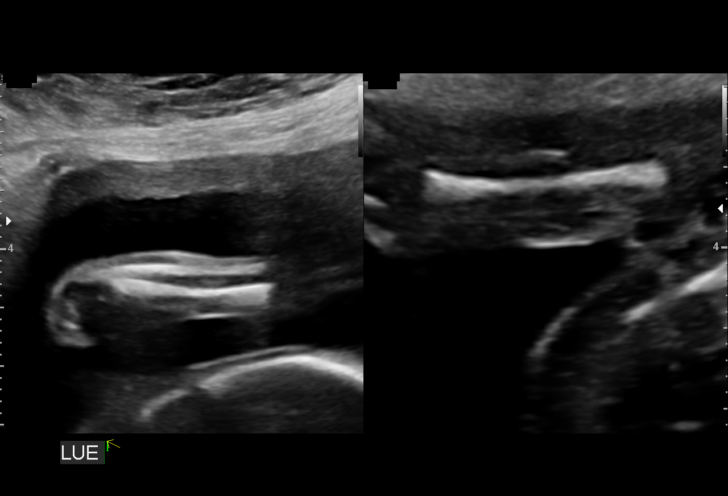
[im 55/62]
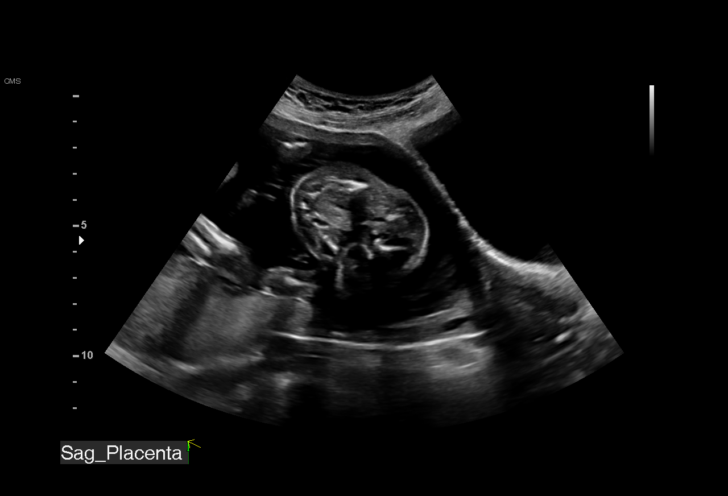
[im 59/62]
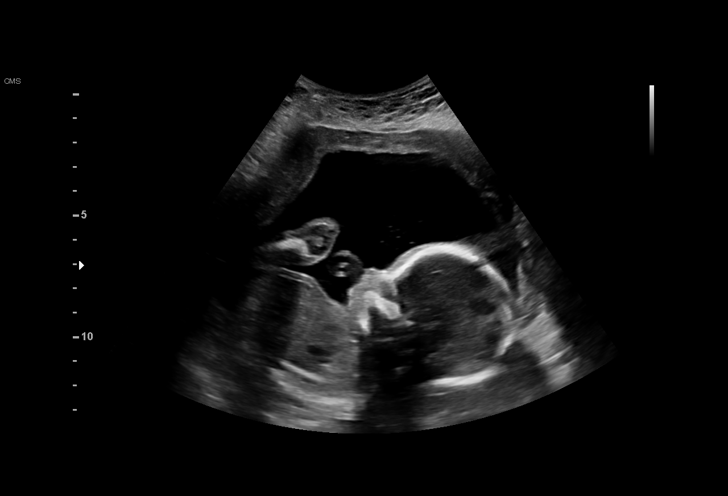

[13 of 28 positions shown; findings below may reference images not displayed]

Road [HOSPITAL]

Indications

19 weeks gestation of pregnancy
Encounter for fetal anatomic survey
Obesity complicating pregnancy, second
trimester
Hypertension - Chronic/Pre-existing (no
meds)
Tobacco use complicating pregnancy,
second trimester
OB History

Gravidity:    3         Term:   2        Prem:   0        SAB:   0
TOP:          0       Ectopic:  0        Living: 2
Fetal Evaluation

Num Of Fetuses:     1
Fetal Heart         149
Rate(bpm):
Cardiac Activity:   Observed
Presentation:       Cephalic
Placenta:           Posterior, above cervical os
P. Cord Insertion:  Visualized

Amniotic Fluid
AFI FV:      Subjectively within normal limits

Largest Pocket(cm)
4.82
Biometry
BPD:        46  mm     G. Age:  19w 6d         59  %    CI:        68.08   %    70 - 86
FL/HC:      16.5   %    16.8 -
HC:      178.3  mm     G. Age:  20w 2d         69  %    HC/AC:      1.08        1.09 -
AC:      165.3  mm     G. Age:  21w 4d         92  %    FL/BPD:     63.9   %
FL:       29.4  mm     G. Age:  19w 0d         21  %    FL/AC:      17.8   %    20 - 24
HUM:        30  mm     G. Age:  19w 6d         56  %
CER:        21  mm     G. Age:  20w 0d         55  %
NFT:       5.2  mm
CM:        4.5  mm

Est. FW:     351  gm    0 lb 12 oz      57  %
Gestational Age

LMP:           19w 5d        Date:  06/17/17                 EDD:   03/24/18
U/S Today:     20w 1d                                        EDD:   03/21/18
Best:          19w 5d     Det. By:  LMP  (06/17/17)          EDD:   03/24/18
Anatomy

Cranium:               Appears normal         Aortic Arch:            Not well visualized
Cavum:                 Appears normal         Ductal Arch:            Not well visualized
Ventricles:            Appears normal         Diaphragm:              Appears normal
Choroid Plexus:        Appears normal         Stomach:                Appears normal, left
sided
Cerebellum:            Appears normal         Abdomen:                Appears normal
Posterior Fossa:       Appears normal         Abdominal Wall:         Appears nml (cord
insert, abd wall)
Nuchal Fold:           Appears normal         Cord Vessels:           Appears normal (3
vessel cord)
Face:                  Appears normal         Kidneys:                Appear normal
(orbits and profile)
Lips:                  Appears normal         Bladder:                Appears normal
Thoracic:              Appears normal         Spine:                  Appears normal
Heart:                 Appears normal         Upper Extremities:      Appears normal
(4CH, axis, and
situs)
RVOT:                  Appears normal         Lower Extremities:      Appears normal
LVOT:                  Appears normal

Other:  Fetus appears to be a male. Heels visualized. Technically difficult due
to fetal position.
Cervix Uterus Adnexa

Cervix
Length:            4.5  cm.
Normal appearance by transabdominal scan.

Uterus
No abnormality visualized.

Left Ovary
Not visualized.

Right Ovary
Not visualized.

Cul De Sac:   No free fluid seen.
Adnexa:       No abnormality visualized.
Impression

Single living intrauterine pregnancy at 19w 5d.
Placenta Posterior, above cervical os.
Appropriate fetal growth.
Normal amniotic fluid volume.
The fetal anatomic survey is not complete secondary to fetal
position
No gross fetal anomalies identified.
The cervix measures 4.5cm transabdominally without
funneling.
The adnexa was not well visualized.
Recommendations

Recommend follow-up ultrasound examination in 
 4 weeks
to complete fetal anatomic survey or as clinically indicated

## 2019-11-20 ENCOUNTER — Telehealth (INDEPENDENT_AMBULATORY_CARE_PROVIDER_SITE_OTHER): Payer: Medicaid Other | Admitting: Advanced Practice Midwife

## 2019-11-20 ENCOUNTER — Encounter: Payer: Self-pay | Admitting: Advanced Practice Midwife

## 2019-11-20 DIAGNOSIS — O99322 Drug use complicating pregnancy, second trimester: Secondary | ICD-10-CM

## 2019-11-20 DIAGNOSIS — Z3A24 24 weeks gestation of pregnancy: Secondary | ICD-10-CM | POA: Diagnosis not present

## 2019-11-20 DIAGNOSIS — F1490 Cocaine use, unspecified, uncomplicated: Secondary | ICD-10-CM | POA: Diagnosis not present

## 2019-11-20 DIAGNOSIS — O99320 Drug use complicating pregnancy, unspecified trimester: Secondary | ICD-10-CM

## 2019-11-20 DIAGNOSIS — Z3482 Encounter for supervision of other normal pregnancy, second trimester: Secondary | ICD-10-CM

## 2019-11-20 DIAGNOSIS — O09522 Supervision of elderly multigravida, second trimester: Secondary | ICD-10-CM

## 2019-11-20 NOTE — Patient Instructions (Signed)

## 2019-11-20 NOTE — Progress Notes (Signed)
   TELEHEALTH VIRTUAL OBSTETRICS VISIT ENCOUNTER NOTE  I connected with Peggy [name] on 11/20/19 at  9:50 AM EDT by telephone at home and verified that I am speaking with the correct person using two identifiers.   I discussed the limitations, risks, security and privacy concerns of performing an evaluation and management service by telephone and the availability of in person appointments. I also discussed with the patient that there may be a patient responsible charge related to this service. The patient expressed understanding and agreed to proceed.  Subjective:  Peggy [name] is a 34 y.o. [phone] at [redacted]w[redacted]d being followed for ongoing prenatal care.  She is currently monitored for the following issues for this low-risk pregnancy and has Vitamin D deficiency; History of gestational diabetes; Postpartum hypertension; Supervision of normal pregnancy; History of pregnancy induced hypertension; Advanced maternal age in multigravida; and Cocaine use complicating pregnancy on their problem list.  Patient reports no complaints. Reports fetal movement. Denies any contractions, bleeding or leaking of fluid.  The following portions of the patient's history were reviewed and updated as appropriate: allergies, current medications, past family history, past medical history, past social history, past surgical history and problem list.   Objective:   General:  Alert, oriented and cooperative.   Mental Status: Normal mood and affect perceived. Normal judgment and thought content.  Rest of physical exam deferred due to type of encounter  Assessment and Plan:  Pregnancy: G4P3003 at [redacted]w[redacted]d 1. Cocaine use complicating pregnancy Not using anymore  2. Encounter for supervision of other normal pregnancy in second trimester   3. Multigravida of advanced maternal age in second trimester No special testing ,39  4.  ? CHTN--take amd record bp weekly  Preterm labor symptoms and general obstetric precautions  including but not limited to vaginal bleeding, contractions, leaking of fluid and fetal movement were reviewed in detail with the patient.  I discussed the assessment and treatment plan with the patient. The patient was provided an opportunity to ask questions and all were answered. The patient agreed with the plan and demonstrated an understanding of the instructions. The patient was advised to call back or seek an in-person office evaluation/go to [facility] at [facility] for any urgent or concerning symptoms. Please refer to After Visit Summary for other counseling recommendations.   I provided 10 minutes of non-face-to-face time during this encounter.  Return in about 4 weeks (around 12/18/2019) for PN2/LROB.  No future appointments.  Jacklyn Shell, CNM [facility] [employer], [facility]

## 2019-12-18 ENCOUNTER — Ambulatory Visit (INDEPENDENT_AMBULATORY_CARE_PROVIDER_SITE_OTHER): Payer: Medicaid Other | Admitting: Advanced Practice Midwife

## 2019-12-18 ENCOUNTER — Other Ambulatory Visit: Payer: Medicaid Other

## 2019-12-18 ENCOUNTER — Encounter: Payer: Self-pay | Admitting: Advanced Practice Midwife

## 2019-12-18 ENCOUNTER — Other Ambulatory Visit: Payer: Self-pay

## 2019-12-18 VITALS — BP 131/86 | HR 101 | Wt 228.0 lb

## 2019-12-18 DIAGNOSIS — Z131 Encounter for screening for diabetes mellitus: Secondary | ICD-10-CM | POA: Diagnosis not present

## 2019-12-18 DIAGNOSIS — Z3483 Encounter for supervision of other normal pregnancy, third trimester: Secondary | ICD-10-CM | POA: Diagnosis not present

## 2019-12-18 DIAGNOSIS — Z331 Pregnant state, incidental: Secondary | ICD-10-CM

## 2019-12-18 DIAGNOSIS — Z1389 Encounter for screening for other disorder: Secondary | ICD-10-CM

## 2019-12-18 DIAGNOSIS — Z3A28 28 weeks gestation of pregnancy: Secondary | ICD-10-CM

## 2019-12-18 LAB — POCT URINALYSIS DIPSTICK OB
Glucose, UA: NEGATIVE
Leukocytes, UA: NEGATIVE
Nitrite, UA: NEGATIVE

## 2019-12-18 NOTE — Progress Notes (Signed)
   LOW-RISK PREGNANCY VISIT Patient name: Peggy [name] MRN [id number]  Date of birth: 1986-07-31 Chief Complaint:   Routine Prenatal Visit  History of Present Illness:   Peggy [name] is a 34 y.o. G73P3003 female at [redacted]w[redacted]d with an Estimated Date of Delivery: 03/11/20 being seen today for ongoing management of a low-risk pregnancy.  Today she reports normal pregnancy complaints. . Contractions: Not present. Vag. Bleeding: None.  Movement: Present. denies leaking of fluid. Review of Systems:   Pertinent items are noted in HPI Denies abnormal vaginal discharge w/ itching/odor/irritation, headaches, visual changes, shortness of breath, chest pain, abdominal pain, severe nausea/vomiting, or problems with urination or bowel movements unless otherwise stated above. Pertinent History Reviewed:  Reviewed past medical,surgical, social, obstetrical and family history.  Reviewed problem list, medications and allergies. Physical Assessment:   Vitals:   12/18/19 0848  BP: 131/86  Pulse: (!) 101  Weight: 228 lb (103.4 kg)  Body mass index is 36.8 kg/m.        Physical Examination:   General appearance: Well appearing, and in no distress  Mental status: Alert, oriented to person, place, and time  Skin: Warm & dry  Cardiovascular: Normal heart rate noted  Respiratory: Normal respiratory effort, no distress  Abdomen: Soft, gravid, nontender  Pelvic: Cervical exam deferred         Extremities:    Fetal Status:     Movement: Present    Chaperone: n/a    Results for orders placed or performed in visit on 12/18/19 (from the past 24 hour(s))  POC Urinalysis Dipstick OB   Collection Time: 12/18/19  8:43 AM  Result Value Ref Range   Color, UA     Clarity, UA     Glucose, UA Negative Negative   Bilirubin, UA     Ketones, UA small    Spec Grav, UA     Blood, UA none    pH, UA     POC,PROTEIN,UA Trace Negative, Trace, Small (1+), Moderate (2+), Large (3+), 4+   Urobilinogen, UA     Nitrite,  UA neg    Leukocytes, UA Negative Negative   Appearance     Odor      Assessment & Plan:  1) Low-risk pregnancy G4P3003 at [redacted]w[redacted]d with an Estimated Date of Delivery: 03/11/20     Meds: No orders of the defined types were placed in this encounter.  Labs/procedures today: PN2  Plan:  Continue routine obstetrical care  Next visit: prefers online    Reviewed: Preterm labor symptoms and general obstetric precautions including but not limited to vaginal bleeding, contractions, leaking of fluid and fetal movement were reviewed in detail with the patient.  All questions were answered.Has home bp cuff. Check bp weekly, let us know if >140/90. Will check every week and write them down for us--may need a dx of CHTN  Interested in waterbirth, to sign up for classes  Follow-up: Return in about 3 weeks (around 01/08/2020) for OB Mychart visit.  Orders Placed This Encounter  Procedures  . POC Urinalysis Dipstick OB   Jacklyn Shell DNP, CNM 12/18/2019 8:58 AM

## 2019-12-18 NOTE — Patient Instructions (Addendum)
Peggy [name], I greatly value your feedback.  If you receive a survey following your visit with [place] today, we appreciate you taking the time to fill it out.  Thanks, [name], DNP, CNM  [facility] HAS MOVED!!! It is now [facility] at [facility] ([address] [place], [place] [address]) Entrance located off of [address] Free 24/7 valet parking   Go to [email or web address] to register for FREE online childbirth classes    Call the office [phone]) or go to [facility] if:  You begin to have strong, frequent contractions  Your water breaks.  Sometimes it is a big gush of fluid, sometimes it is just a trickle that keeps getting your panties wet or running down your legs  You have vaginal bleeding.  It is normal to have a small amount of spotting if your cervix was checked.   You don't feel your baby moving like normal.  If you don't, get you something to eat and drink and lay down and focus on feeling your baby move.  You should feel at least 10 movements in 2 hours.  If you don't, you should call the office or go to [facility]   Home Blood Pressure Monitoring for Patients   Your provider has recommended that you check your blood pressure (BP) at least once a week at home. If you do not have a blood pressure cuff at home, one will be provided for you. Contact your provider if you have not received your monitor within 1 week.   Helpful Tips for Accurate Home Blood Pressure Checks  . Don't smoke, exercise, or drink caffeine 30 minutes before checking your BP . Use the restroom before checking your BP (a full bladder can raise your pressure) . Relax in a comfortable upright chair . Feet on the ground . Left arm resting comfortably on a flat surface at the level of your heart . Legs uncrossed . Back supported . Sit quietly and don't talk . Place the cuff on your bare arm . Adjust snuggly, so that only two fingertips can fit  between your skin and the top of the cuff . Check 2 readings separated by at least one minute . Keep a log of your BP readings . For a visual, please reference this diagram: http://ccnc.care/bpdiagram  Provider Name: [facility]     Phone: [phone]  Zone 1: ALL CLEAR  Continue to monitor your symptoms:  . BP reading is less than 140 (top number) or less than 90 (bottom number)  . No right upper stomach pain . No headaches or seeing spots . No feeling nauseated or throwing up . No swelling in face and hands  Zone 2: CAUTION Call your doctor's office for any of the following:  . BP reading is greater than 140 (top number) or greater than 90 (bottom number)  . Stomach pain under your ribs in the middle or right side . Headaches or seeing spots . Feeling nauseated or throwing up . Swelling in face and hands  Zone 3: EMERGENCY  Seek immediate medical care if you have any of the following:  . BP reading is greater than160 (top number) or greater than 110 (bottom number) . Severe headaches not improving with Tylenol . Serious difficulty catching your breath . Any worsening symptoms from Zone 2    [email or web address] to register for waterbirth class

## 2019-12-19 LAB — ANTIBODY SCREEN: Antibody Screen: NEGATIVE

## 2019-12-19 LAB — CBC
Hematocrit: 37.6 % (ref 34.0–46.6)
Hemoglobin: 12.7 g/dL (ref 11.1–15.9)
MCH: 28.7 pg (ref 26.6–33.0)
MCHC: 33.8 g/dL (ref 31.5–35.7)
MCV: 85 fL (ref 79–97)
Platelets: 162 10*3/uL (ref 150–450)
RBC: 4.43 x10E6/uL (ref 3.77–5.28)
RDW: 13 % (ref 11.7–15.4)
WBC: 9.6 10*3/uL (ref 3.4–10.8)

## 2019-12-19 LAB — GLUCOSE TOLERANCE, 2 HOURS W/ 1HR
Glucose, 1 hour: 127 mg/dL (ref 65–179)
Glucose, 2 hour: 86 mg/dL (ref 65–152)
Glucose, Fasting: 84 mg/dL (ref 65–91)

## 2019-12-19 LAB — HIV ANTIBODY (ROUTINE TESTING W REFLEX): HIV Screen 4th Generation wRfx: NONREACTIVE

## 2019-12-19 LAB — RPR: RPR Ser Ql: NONREACTIVE

## 2020-01-10 ENCOUNTER — Telehealth (INDEPENDENT_AMBULATORY_CARE_PROVIDER_SITE_OTHER): Payer: Medicaid Other | Admitting: Women's Health

## 2020-01-10 ENCOUNTER — Encounter: Payer: Self-pay | Admitting: Women's Health

## 2020-01-10 VITALS — BP 137/73 | HR 94

## 2020-01-10 DIAGNOSIS — O09293 Supervision of pregnancy with other poor reproductive or obstetric history, third trimester: Secondary | ICD-10-CM

## 2020-01-10 DIAGNOSIS — O09523 Supervision of elderly multigravida, third trimester: Secondary | ICD-10-CM

## 2020-01-10 DIAGNOSIS — Z3A31 31 weeks gestation of pregnancy: Secondary | ICD-10-CM

## 2020-01-10 DIAGNOSIS — O99323 Drug use complicating pregnancy, third trimester: Secondary | ICD-10-CM

## 2020-01-10 DIAGNOSIS — Z3483 Encounter for supervision of other normal pregnancy, third trimester: Secondary | ICD-10-CM

## 2020-01-10 NOTE — Patient Instructions (Signed)
Peggy [name], I greatly value your feedback.  If you receive a survey following your visit with [place] today, we appreciate you taking the time to fill it out.  Thanks, Peggy [name], CNM, WHNP-BC  [facility] at [facility] ([address] [place], [place] [address]) Entrance C, located off of [address] valet parking   Go to [email or web address] to register for FREE online childbirth classes    Call the office [phone]) or go to [facility] if:  You begin to have strong, frequent contractions  Your water breaks.  Sometimes it is a big gush of fluid, sometimes it is just a trickle that keeps getting your panties wet or running down your legs  You have vaginal bleeding.  It is normal to have a small amount of spotting if your cervix was checked.   You don't feel your baby moving like normal.  If you don't, get you something to eat and drink and lay down and focus on feeling your baby move.  You should feel at least 10 movements in 2 hours.  If you don't, you should call the office or go to [facility].   Call the office [phone]) or go to [facility] hospital for these signs of pre-eclampsia:  Severe headache that does not go away with Tylenol  Visual changes- seeing spots, double, blurred vision  Pain under your right breast or upper abdomen that does not go away with Tums or heartburn medicine  Nausea and/or vomiting  Severe swelling in your hands, feet, and face    Home Blood Pressure Monitoring for Patients   Your provider has recommended that you check your blood pressure (BP) at least once a week at home. If you do not have a blood pressure cuff at home, one will be provided for you. Contact your provider if you have not received your monitor within 1 week.   Helpful Tips for Accurate Home Blood Pressure Checks  . Don't smoke, exercise, or drink caffeine 30 minutes before checking your BP . Use the restroom before checking your BP (a full  bladder can raise your pressure) . Relax in a comfortable upright chair . Feet on the ground . Left arm resting comfortably on a flat surface at the level of your heart . Legs uncrossed . Back supported . Sit quietly and don't talk . Place the cuff on your bare arm . Adjust snuggly, so that only two fingertips can fit between your skin and the top of the cuff . Check 2 readings separated by at least one minute . Keep a log of your BP readings . For a visual, please reference this diagram: http://ccnc.care/bpdiagram  Provider Name: [facility]     Phone: [phone]  Zone 1: ALL CLEAR  Continue to monitor your symptoms:  . BP reading is less than 140 (top number) or less than 90 (bottom number)  . No right upper stomach pain . No headaches or seeing spots . No feeling nauseated or throwing up . No swelling in face and hands  Zone 2: CAUTION Call your doctor's office for any of the following:  . BP reading is greater than 140 (top number) or greater than 90 (bottom number)  . Stomach pain under your ribs in the middle or right side . Headaches or seeing spots . Feeling nauseated or throwing up . Swelling in face and hands  Zone 3: EMERGENCY  Seek immediate medical care if you have any of  the following:  . BP reading is greater than160 (top number) or greater than 110 (bottom number) . Severe headaches not improving with Tylenol . Serious difficulty catching your breath . Any worsening symptoms from Zone 2  Preterm Labor and Birth Information  The normal length of a pregnancy is 39-41 weeks. Preterm labor is when labor starts before 37 completed weeks of pregnancy. What are the risk factors for preterm labor? Preterm labor is more likely to occur in women who:  Have certain infections during pregnancy such as a bladder infection, sexually transmitted infection, or infection inside the uterus (chorioamnionitis).  Have a shorter-than-normal cervix.  Have gone into  preterm labor before.  Have had surgery on their cervix.  Are younger than age 54 or older than age 25.  Are African American.  Are pregnant with twins or multiple babies (multiple gestation).  Take street drugs or smoke while pregnant.  Do not gain enough weight while pregnant.  Became pregnant shortly after having been pregnant. What are the symptoms of preterm labor? Symptoms of preterm labor include:  Cramps similar to those that can happen during a menstrual period. The cramps may happen with diarrhea.  Pain in the abdomen or lower back.  Regular uterine contractions that may feel like tightening of the abdomen.  A feeling of increased pressure in the pelvis.  Increased watery or bloody mucus discharge from the vagina.  Water breaking (ruptured amniotic sac). Why is it important to recognize signs of preterm labor? It is important to recognize signs of preterm labor because babies who are born prematurely may not be fully developed. This can put them at an increased risk for:  Long-term (chronic) heart and lung problems.  Difficulty immediately after birth with regulating body systems, including blood sugar, body temperature, heart rate, and breathing rate.  Bleeding in the brain.  Cerebral palsy.  Learning difficulties.  Death. These risks are highest for babies who are born before 48 weeks of pregnancy. How is preterm labor treated? Treatment depends on the length of your pregnancy, your condition, and the health of your baby. It may involve: 1. Having a stitch (suture) placed in your cervix to prevent your cervix from opening too early (cerclage). 2. Taking or being given medicines, such as: ? Hormone medicines. These may be given early in pregnancy to help support the pregnancy. ? Medicine to stop contractions. ? Medicines to help mature the baby's lungs. These may be prescribed if the risk of delivery is high. ? Medicines to prevent your baby from  developing cerebral palsy. If the labor happens before 34 weeks of pregnancy, you may need to stay in the hospital. What should I do if I think I am in preterm labor? If you think that you are going into preterm labor, call your health care provider right away. How can I prevent preterm labor in future pregnancies? To increase your chance of having a full-term pregnancy:  Do not use any tobacco products, such as cigarettes, chewing tobacco, and e-cigarettes. If you need help quitting, ask your health care provider.  Do not use street drugs or medicines that have not been prescribed to you during your pregnancy.  Talk with your health care provider before taking any herbal supplements, even if you have been taking them regularly.  Make sure you gain a healthy amount of weight during your pregnancy.  Watch for infection. If you think that you might have an infection, get it checked right away.  Make sure to  tell your health care provider if you have gone into preterm labor before. This information is not intended to replace advice given to you by your health care provider. Make sure you discuss any questions you have with your health care provider. Document Revised: 11/25/2018 Document Reviewed: 12/25/2015 Elsevier Patient Education  2020 [employer].  Considering [place]? Guide for patients at [facility] [employer] Why consider waterbirth? . Gentle birth for babies  . Less pain medicine used in labor  . May allow for passive descent/less pushing  . May reduce perineal tears  . More mobility and instinctive maternal position changes  . Increased maternal relaxation  . Reduced blood pressure in labor   Is waterbirth safe? What are the risks of infection, drowning or other complications? . Infection:  Marland Kitchen Very low risk (3.7 % for tub vs 4.8% for bed)  . 7 in 8000 waterbirths with documented infection  . Poorly cleaned equipment most common cause  . Slightly lower group B  strep transmission rate  . Drowning  . Maternal:  . Very low risk  . Related to seizures or fainting  . Newborn:  Marland Kitchen Very low risk. No evidence of increased risk of respiratory problems in multiple large studies  . Physiological protection from breathing under water  . Avoid underwater birth if there are any fetal complications  . Once baby's head is out of the water, keep it out.  . Birth complication  . Some reports of cord trauma, but risk decreased by bringing baby to surface gradually  . No evidence of increased risk of shoulder dystocia. Mothers can usually change positions faster in water than in a bed, possibly aiding the maneuvers to free the shoulder.  ? You must attend a Waterbirth class at [employer] at [facility] . 3rd Wednesday of every month from 7-9pm  . Free  . Register by calling [phone] or online at [email or web address]  . Bring [place] the certificate from the class to your prenatal appointment  Meet with a midwife at 36 weeks to see if you can still plan a waterbirth and to sign the consent.   If you plan a waterbirth at [facility] and [facility], you can opt to purchase the following: . Fish [employer] . Bathing suit top (optional)  . Long-handled mirror (optional)  .  Things that would prevent you from having a waterbirth: . Unknown or Positive COVID-19 diagnosis upon admission to hospital  . Premature, <37wks  . Previous cesarean birth  . Presence of thick meconium-stained fluid  . Multiple gestation (Twins, triplets, etc.)  . Uncontrolled diabetes or gestational diabetes requiring medication  . Hypertension requiring medication or diagnosis of pre-eclampsia  . Heavy vaginal bleeding  . Non-reassuring fetal heart rate  . Active infection (MRSA, etc.). Group B Strep is NOT a contraindication for waterbirth.  . If your labor has to be induced and induction method requires continuous monitoring of the baby's heart rate   . Other risks/issues identified by your obstetrical provider  Please remember that birth is unpredictable. Under certain unforeseeable circumstances your provider may advise against giving birth in the tub. These decisions will be made on a case-by-case basis and with the safety of you and your baby as our highest priority.  **Please remember that in order to have a waterbirth, you must test Negative to COVID-19 upon admission to the hospital.**

## 2020-01-10 NOTE — Progress Notes (Signed)
TELEHEALTH VIRTUAL OBSTETRICS VISIT ENCOUNTER NOTE Patient name: Peggy [name] MRN [id number]  Date of birth: 06/23/1986  I connected with patient on 01/10/20 at 10:50 AM EDT by MyChart video  and verified that I am speaking with the correct person using two identifiers. Pt is not currently in our office, she is at home.  The provider is in the office.    I discussed the limitations, risks, security and privacy concerns of performing an evaluation and management service by telephone and the availability of in person appointments. I also discussed with the patient that there may be a patient responsible charge related to this service. The patient expressed understanding and agreed to proceed.  Chief Complaint:   Routine Prenatal Visit  History of Present Illness:   Peggy [name] is a 34 y.o. [phone] female at [redacted]w[redacted]d with an Estimated Date of Delivery: 03/11/20 being evaluated today for ongoing management of a low-risk pregnancy.  Depression screen [facility] 2/9 09/04/2019 07/19/2018  Decreased Interest 0 0  Down, Depressed, Hopeless 0 0  PHQ - 2 Score 0 0    Today she reports no complaints. 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 No cocaine 'since beginning of pregnancy'. Home bp's 120s-130s/70-80s.  Contractions: Not present. Vag. Bleeding: None.  Movement: Present. denies leaking of fluid. Review of Systems:   Pertinent items are noted in HPI Denies abnormal vaginal discharge w/ itching/odor/irritation, headaches, visual changes, shortness of breath, chest pain, abdominal pain, severe nausea/vomiting, or problems with urination or bowel movements unless otherwise stated above. Pertinent History Reviewed:  Reviewed past medical,surgical, social, obstetrical and family history.  Reviewed problem list, medications and allergies. Physical Assessment:   Vitals:   01/10/20 1108  BP: 137/73  Pulse: 94  There is no height or weight on file to calculate BMI.        Physical  Examination:   General:  Alert, oriented and cooperative.   Mental Status: Normal mood and affect perceived. Normal judgment and thought content.  Rest of physical exam deferred due to type of encounter  No results found for this or any previous visit (from the past 24 hour(s)).  Assessment & Plan:  1) Pregnancy G4P3003 at [redacted]w[redacted]d with an Estimated Date of Delivery: 03/11/20   2) H/O GHTN, normal bp's. Start checking daily, let us know if persistently >140/90. Reviewed pre-e s/s, reasons to seek care. 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   Labs/procedures today: none  Plan:  Continue routine obstetrical care.  Has home bp cuff.   Next visit: prefers online    Reviewed: Preterm labor symptoms and general obstetric precautions including but not limited to vaginal bleeding, contractions, leaking of fluid and fetal movement were reviewed in detail with the patient. The patient was advised to call back or seek an in-person office evaluation/go to MAU at [facility] for any urgent or concerning symptoms. All questions were answered. Please refer to After Visit Summary for other counseling recommendations.    I provided 15 minutes of non-face-to-face time during this encounter.  Follow-up: Return in about 2 weeks (around 01/24/2020) for LROB, CNM, MyChart Video.  No orders of the defined types were placed in this encounter.  [place],  WHNP-BC 01/10/2020 11:47 AM

## 2020-01-25 ENCOUNTER — Encounter: Payer: Self-pay | Admitting: Women's Health

## 2020-01-25 ENCOUNTER — Telehealth (INDEPENDENT_AMBULATORY_CARE_PROVIDER_SITE_OTHER): Payer: Medicaid Other | Admitting: Women's Health

## 2020-01-25 VITALS — BP 139/84 | HR 107

## 2020-01-25 DIAGNOSIS — Z3A33 33 weeks gestation of pregnancy: Secondary | ICD-10-CM

## 2020-01-25 DIAGNOSIS — Z3483 Encounter for supervision of other normal pregnancy, third trimester: Secondary | ICD-10-CM

## 2020-01-25 DIAGNOSIS — Z8759 Personal history of other complications of pregnancy, childbirth and the puerperium: Secondary | ICD-10-CM

## 2020-01-25 NOTE — Progress Notes (Signed)
TELEHEALTH VIRTUAL OBSTETRICS VISIT ENCOUNTER NOTE Patient name: Peggy [name] MRN [id number]  Date of birth: [birth year]  I connected with patient on 01/25/20 at  4:10 PM EDT by MyChart video  and verified that I am speaking with the correct person using two identifiers. Pt is not currently in our office, she is at home.  The provider is in the office.    I discussed the limitations, risks, security and privacy concerns of performing an evaluation and management service by telephone and the availability of in person appointments. I also discussed with the patient that there may be a patient responsible charge related to this service. The patient expressed understanding and agreed to proceed.  Chief Complaint:   Routine Prenatal Visit  History of Present Illness:   Peggy [name] is a 34 y.o. [phone] female at [redacted]w[redacted]d with an Estimated Date of Delivery: 03/11/20 being evaluated today for ongoing management of a low-risk pregnancy.  Depression screen [facility] 2/9 09/04/2019 07/19/2018  Decreased Interest 0 0  Down, Depressed, Hopeless 0 0  PHQ - 2 Score 0 0    Today she reports pelvis feels sore today. Denies ha, visual changes, ruq/epigastric pain, n/v.  A little lightheaded today.  Contractions: Not present. Vag. Bleeding: None.  Movement: Present. denies leaking of fluid. Review of Systems:   Pertinent items are noted in HPI Denies abnormal vaginal discharge w/ itching/odor/irritation, headaches, visual changes, shortness of breath, chest pain, abdominal pain, severe nausea/vomiting, or problems with urination or bowel movements unless otherwise stated above. Pertinent History Reviewed:  Reviewed past medical,surgical, social, obstetrical and family history.  Reviewed problem list, medications and allergies. Physical Assessment:   Vitals:   01/25/20 1630  BP: 139/84  Pulse: (!) 107  There is no height or weight on file to calculate BMI.        Physical Examination:   General:  Alert,  oriented and cooperative.   Mental Status: Normal mood and affect perceived. Normal judgment and thought content.  Rest of physical exam deferred due to type of encounter  No results found for this or any previous visit (from the past 24 hour(s)).  Assessment & Plan:  1) Pregnancy G4P3003 at [redacted]w[redacted]d with an Estimated Date of Delivery: 03/11/20   2) H/O gHTN, bp borderline today, asymptomatic. To come Monday for bp check w/ nurse. Check bp at least daily until then. Reviewed pre-e s/s, reasons to seek care  3) H/O GDM> normal GTT this pregnancy  4) Interested in waterbirth> took class this month   Meds: No orders of the defined types were placed in this encounter.   Labs/procedures today: none  Plan:  Continue routine obstetrical care.  Has home bp cuff.  Check bp daily, let us know if >140/90.  Next visit: prefers in person for bp check   Reviewed: Preterm labor symptoms and general obstetric precautions including but not limited to vaginal bleeding, contractions, leaking of fluid and fetal movement were reviewed in detail with the patient. The patient was advised to call back or seek an in-person office evaluation/go to MAU at [facility] for any urgent or concerning symptoms. All questions were answered. Please refer to After Visit Summary for other counseling recommendations.    I provided 15 minutes of non-face-to-face time during this encounter.  Follow-up: Return in about 4 days (around 01/29/2020) for nurse visit bp check.  No orders of the defined types were placed in this encounter.  Cheral Marker CNM, [facility] 01/25/2020  4:42 PM

## 2020-01-25 NOTE — Patient Instructions (Signed)
Peggy [name], I greatly value your feedback.  If you receive a survey following your visit with [place] today, we appreciate you taking the time to fill it out.  Thanks, [name], CNM, WHNP-BC  [facility] ([address] [place], [place] [address]) Entrance C, located off of [address] valet parking   Go to [email or web address] to register for FREE online childbirth classes    Call the office [phone]) or go to [facility] if:  You begin to have strong, frequent contractions  Your water breaks.  Sometimes it is a big gush of fluid, sometimes it is just a trickle that keeps getting your panties wet or running down your legs  You have vaginal bleeding.  It is normal to have a small amount of spotting if your cervix was checked.   You don't feel your baby moving like normal.  If you don't, get you something to eat and drink and lay down and focus on feeling your baby move.  You should feel at least 10 movements in 2 hours.  If you don't, you should call the office or go to [facility].   Call the office [phone]) or go to [facility] hospital for these signs of pre-eclampsia:  Severe headache that does not go away with Tylenol  Visual changes- seeing spots, double, blurred vision  Pain under your right breast or upper abdomen that does not go away with Tums or heartburn medicine  Nausea and/or vomiting  Severe swelling in your hands, feet, and face    Home Blood Pressure Monitoring for Patients   Your provider has recommended that you check your blood pressure (BP) at least once a week at home. If you do not have a blood pressure cuff at home, one will be provided for you. Contact your provider if you have not received your monitor within 1 week.   Helpful Tips for Accurate Home Blood Pressure Checks  . Don't smoke, exercise, or drink caffeine 30 minutes before checking your BP . Use the restroom before checking your BP (a full  bladder can raise your pressure) . Relax in a comfortable upright chair . Feet on the ground . Left arm resting comfortably on a flat surface at the level of your heart . Legs uncrossed . Back supported . Sit quietly and don't talk . Place the cuff on your bare arm . Adjust snuggly, so that only two fingertips can fit between your skin and the top of the cuff . Check 2 readings separated by at least one minute . Keep a log of your BP readings . For a visual, please reference this diagram: http://ccnc.care/bpdiagram  Provider Name: [facility]     Phone: [phone]  Zone 1: ALL CLEAR  Continue to monitor your symptoms:  . BP reading is less than 140 (top number) or less than 90 (bottom number)  . No right upper stomach pain . No headaches or seeing spots . No feeling nauseated or throwing up . No swelling in face and hands  Zone 2: CAUTION Call your doctor's office for any of the following:  . BP reading is greater than 140 (top number) or greater than 90 (bottom number)  . Stomach pain under your ribs in the middle or right side . Headaches or seeing spots . Feeling nauseated or throwing up . Swelling in face and hands  Zone 3: EMERGENCY  Seek immediate medical care if you have any of  the following:  . BP reading is greater than160 (top number) or greater than 110 (bottom number) . Severe headaches not improving with Tylenol . Serious difficulty catching your breath . Any worsening symptoms from Zone 2  Preterm Labor and Birth Information  The normal length of a pregnancy is 39-41 weeks. Preterm labor is when labor starts before 37 completed weeks of pregnancy. What are the risk factors for preterm labor? Preterm labor is more likely to occur in women who:  Have certain infections during pregnancy such as a bladder infection, sexually transmitted infection, or infection inside the uterus (chorioamnionitis).  Have a shorter-than-normal cervix.  Have gone into  preterm labor before.  Have had surgery on their cervix.  Are younger than age 54 or older than age 25.  Are African American.  Are pregnant with twins or multiple babies (multiple gestation).  Take street drugs or smoke while pregnant.  Do not gain enough weight while pregnant.  Became pregnant shortly after having been pregnant. What are the symptoms of preterm labor? Symptoms of preterm labor include:  Cramps similar to those that can happen during a menstrual period. The cramps may happen with diarrhea.  Pain in the abdomen or lower back.  Regular uterine contractions that may feel like tightening of the abdomen.  A feeling of increased pressure in the pelvis.  Increased watery or bloody mucus discharge from the vagina.  Water breaking (ruptured amniotic sac). Why is it important to recognize signs of preterm labor? It is important to recognize signs of preterm labor because babies who are born prematurely may not be fully developed. This can put them at an increased risk for:  Long-term (chronic) heart and lung problems.  Difficulty immediately after birth with regulating body systems, including blood sugar, body temperature, heart rate, and breathing rate.  Bleeding in the brain.  Cerebral palsy.  Learning difficulties.  Death. These risks are highest for babies who are born before 48 weeks of pregnancy. How is preterm labor treated? Treatment depends on the length of your pregnancy, your condition, and the health of your baby. It may involve: 1. Having a stitch (suture) placed in your cervix to prevent your cervix from opening too early (cerclage). 2. Taking or being given medicines, such as: ? Hormone medicines. These may be given early in pregnancy to help support the pregnancy. ? Medicine to stop contractions. ? Medicines to help mature the baby's lungs. These may be prescribed if the risk of delivery is high. ? Medicines to prevent your baby from  developing cerebral palsy. If the labor happens before 34 weeks of pregnancy, you may need to stay in the hospital. What should I do if I think I am in preterm labor? If you think that you are going into preterm labor, call your health care provider right away. How can I prevent preterm labor in future pregnancies? To increase your chance of having a full-term pregnancy:  Do not use any tobacco products, such as cigarettes, chewing tobacco, and e-cigarettes. If you need help quitting, ask your health care provider.  Do not use street drugs or medicines that have not been prescribed to you during your pregnancy.  Talk with your health care provider before taking any herbal supplements, even if you have been taking them regularly.  Make sure you gain a healthy amount of weight during your pregnancy.  Watch for infection. If you think that you might have an infection, get it checked right away.  Make sure to  tell your health care provider if you have gone into preterm labor before. This information is not intended to replace advice given to you by your health care provider. Make sure you discuss any questions you have with your health care provider. Document Revised: 11/25/2018 Document Reviewed: 12/25/2015 Elsevier Patient Education  [place].

## 2020-01-29 ENCOUNTER — Ambulatory Visit (INDEPENDENT_AMBULATORY_CARE_PROVIDER_SITE_OTHER): Payer: Medicaid Other | Admitting: *Deleted

## 2020-01-29 DIAGNOSIS — Z3A34 34 weeks gestation of pregnancy: Secondary | ICD-10-CM

## 2020-01-29 DIAGNOSIS — F1490 Cocaine use, unspecified, uncomplicated: Secondary | ICD-10-CM

## 2020-01-29 DIAGNOSIS — O09523 Supervision of elderly multigravida, third trimester: Secondary | ICD-10-CM

## 2020-01-29 DIAGNOSIS — Z0130 Encounter for examination of blood pressure without abnormal findings: Secondary | ICD-10-CM

## 2020-01-29 DIAGNOSIS — Z3483 Encounter for supervision of other normal pregnancy, third trimester: Secondary | ICD-10-CM

## 2020-01-29 NOTE — Progress Notes (Signed)
   NURSE VISIT- BLOOD PRESSURE CHECK  SUBJECTIVE:  Peggy [name] is a 34 y.o. [phone] female here for BP check. She is [redacted]w[redacted]d pregnant    HYPERTENSION ROS:  Pregnant:  . Severe headaches that don't go away with tylenol/other medicines: No  . Visual changes (seeing spots/double/blurred vision) No  . Severe pain under right breast breast or in center of upper chest No  . Severe nausea/vomiting No  . Taking medicines as instructed not applicable  OBJECTIVE:  LMP 06/05/2019 (Within Days)   Appearance alert, well appearing, and in no distress and oriented to person, place, and time.  ASSESSMENT: Pregnancy [redacted]w[redacted]d  blood pressure check  PLAN: Discussed with [name], CNM, [facility]   Recommendations: no changes needed   Follow-up: 10 days   Jobe Marker  01/29/2020 11:20 AM

## 2020-02-08 ENCOUNTER — Other Ambulatory Visit: Payer: Self-pay

## 2020-02-08 ENCOUNTER — Encounter: Payer: Self-pay | Admitting: Women's Health

## 2020-02-08 ENCOUNTER — Ambulatory Visit (INDEPENDENT_AMBULATORY_CARE_PROVIDER_SITE_OTHER): Payer: Medicaid Other | Admitting: Women's Health

## 2020-02-08 VITALS — BP 137/84 | HR 105 | Wt 232.4 lb

## 2020-02-08 DIAGNOSIS — Z23 Encounter for immunization: Secondary | ICD-10-CM

## 2020-02-08 DIAGNOSIS — Z3A35 35 weeks gestation of pregnancy: Secondary | ICD-10-CM

## 2020-02-08 DIAGNOSIS — Z1389 Encounter for screening for other disorder: Secondary | ICD-10-CM

## 2020-02-08 DIAGNOSIS — Z331 Pregnant state, incidental: Secondary | ICD-10-CM

## 2020-02-08 DIAGNOSIS — Z3483 Encounter for supervision of other normal pregnancy, third trimester: Secondary | ICD-10-CM

## 2020-02-08 LAB — POCT URINALYSIS DIPSTICK OB
Blood, UA: NEGATIVE
Glucose, UA: NEGATIVE
Leukocytes, UA: NEGATIVE
Nitrite, UA: NEGATIVE
POC,PROTEIN,UA: NEGATIVE

## 2020-02-08 NOTE — Patient Instructions (Signed)
Peggy [name], I greatly value your feedback.  If you receive a survey following your visit with [place] today, we appreciate you taking the time to fill it out.  Thanks, [name], CNM, WHNP-BC  [facility] ([address] [place], [place] [address]) Entrance C, located off of [address] valet parking   Go to [email or web address] to register for FREE online childbirth classes    Call the office [phone]) or go to [facility] if:  You begin to have strong, frequent contractions  Your water breaks.  Sometimes it is a big gush of fluid, sometimes it is just a trickle that keeps getting your panties wet or running down your legs  You have vaginal bleeding.  It is normal to have a small amount of spotting if your cervix was checked.   You don't feel your baby moving like normal.  If you don't, get you something to eat and drink and lay down and focus on feeling your baby move.  You should feel at least 10 movements in 2 hours.  If you don't, you should call the office or go to [facility].   Call the office [phone]) or go to [facility] hospital for these signs of pre-eclampsia:  Severe headache that does not go away with Tylenol  Visual changes- seeing spots, double, blurred vision  Pain under your right breast or upper abdomen that does not go away with Tums or heartburn medicine  Nausea and/or vomiting  Severe swelling in your hands, feet, and face    Home Blood Pressure Monitoring for Patients   Your provider has recommended that you check your blood pressure (BP) at least once a week at home. If you do not have a blood pressure cuff at home, one will be provided for you. Contact your provider if you have not received your monitor within 1 week.   Helpful Tips for Accurate Home Blood Pressure Checks  . Don't smoke, exercise, or drink caffeine 30 minutes before checking your BP . Use the restroom before checking your BP (a full  bladder can raise your pressure) . Relax in a comfortable upright chair . Feet on the ground . Left arm resting comfortably on a flat surface at the level of your heart . Legs uncrossed . Back supported . Sit quietly and don't talk . Place the cuff on your bare arm . Adjust snuggly, so that only two fingertips can fit between your skin and the top of the cuff . Check 2 readings separated by at least one minute . Keep a log of your BP readings . For a visual, please reference this diagram: http://ccnc.care/bpdiagram  Provider Name: [facility]     Phone: [phone]  Zone 1: ALL CLEAR  Continue to monitor your symptoms:  . BP reading is less than 140 (top number) or less than 90 (bottom number)  . No right upper stomach pain . No headaches or seeing spots . No feeling nauseated or throwing up . No swelling in face and hands  Zone 2: CAUTION Call your doctor's office for any of the following:  . BP reading is greater than 140 (top number) or greater than 90 (bottom number)  . Stomach pain under your ribs in the middle or right side . Headaches or seeing spots . Feeling nauseated or throwing up . Swelling in face and hands  Zone 3: EMERGENCY  Seek immediate medical care if you have any of  the following:  . BP reading is greater than160 (top number) or greater than 110 (bottom number) . Severe headaches not improving with Tylenol . Serious difficulty catching your breath . Any worsening symptoms from Zone 2  Preterm Labor and Birth Information  The normal length of a pregnancy is 39-41 weeks. Preterm labor is when labor starts before 37 completed weeks of pregnancy. What are the risk factors for preterm labor? Preterm labor is more likely to occur in women who:  Have certain infections during pregnancy such as a bladder infection, sexually transmitted infection, or infection inside the uterus (chorioamnionitis).  Have a shorter-than-normal cervix.  Have gone into  preterm labor before.  Have had surgery on their cervix.  Are younger than age 54 or older than age 25.  Are African American.  Are pregnant with twins or multiple babies (multiple gestation).  Take street drugs or smoke while pregnant.  Do not gain enough weight while pregnant.  Became pregnant shortly after having been pregnant. What are the symptoms of preterm labor? Symptoms of preterm labor include:  Cramps similar to those that can happen during a menstrual period. The cramps may happen with diarrhea.  Pain in the abdomen or lower back.  Regular uterine contractions that may feel like tightening of the abdomen.  A feeling of increased pressure in the pelvis.  Increased watery or bloody mucus discharge from the vagina.  Water breaking (ruptured amniotic sac). Why is it important to recognize signs of preterm labor? It is important to recognize signs of preterm labor because babies who are born prematurely may not be fully developed. This can put them at an increased risk for:  Long-term (chronic) heart and lung problems.  Difficulty immediately after birth with regulating body systems, including blood sugar, body temperature, heart rate, and breathing rate.  Bleeding in the brain.  Cerebral palsy.  Learning difficulties.  Death. These risks are highest for babies who are born before 48 weeks of pregnancy. How is preterm labor treated? Treatment depends on the length of your pregnancy, your condition, and the health of your baby. It may involve: 1. Having a stitch (suture) placed in your cervix to prevent your cervix from opening too early (cerclage). 2. Taking or being given medicines, such as: ? Hormone medicines. These may be given early in pregnancy to help support the pregnancy. ? Medicine to stop contractions. ? Medicines to help mature the baby's lungs. These may be prescribed if the risk of delivery is high. ? Medicines to prevent your baby from  developing cerebral palsy. If the labor happens before 34 weeks of pregnancy, you may need to stay in the hospital. What should I do if I think I am in preterm labor? If you think that you are going into preterm labor, call your health care provider right away. How can I prevent preterm labor in future pregnancies? To increase your chance of having a full-term pregnancy:  Do not use any tobacco products, such as cigarettes, chewing tobacco, and e-cigarettes. If you need help quitting, ask your health care provider.  Do not use street drugs or medicines that have not been prescribed to you during your pregnancy.  Talk with your health care provider before taking any herbal supplements, even if you have been taking them regularly.  Make sure you gain a healthy amount of weight during your pregnancy.  Watch for infection. If you think that you might have an infection, get it checked right away.  Make sure to  tell your health care provider if you have gone into preterm labor before. This information is not intended to replace advice given to you by your health care provider. Make sure you discuss any questions you have with your health care provider. Document Revised: 11/25/2018 Document Reviewed: 12/25/2015 Elsevier Patient Education  2020 Elsevier Inc[employer]  Considering Waterbirth? Guide for patients at [facility] Why consider waterbirth? . Gentle birth for babies  . Less pain medicine used in labor  . May allow for passive descent/less pushing  . May reduce perineal tears  . More mobility and instinctive maternal position changes  . Increased maternal relaxation  . Reduced blood pressure in labor   Is waterbirth safe? What are the risks of infection, drowning or other complications? . Infection:  . Very low risk (3.7 % for tub vs 4.8% for bed)  . 7 in 8000 waterbirths with documented infection  . Poorly cleaned equipment most common cause  . Slightly lower group B  strep transmission rate  . Drowning  . Maternal:  . Very low risk  . Related to seizures or fainting  . Newborn:  . Very low risk. No evidence of increased risk of respiratory problems in multiple large studies  . Physiological protection from breathing under water  . Avoid underwater birth if there are any fetal complications  . Once baby's head is out of the water, keep it out.  . Birth complication  . Some reports of cord trauma, but risk decreased by bringing baby to surface gradually  . No evidence of increased risk of shoulder dystocia. Mothers can usually change positions faster in water than in a bed, possibly aiding the maneuvers to free the shoulder.  ? You must attend a Waterbirth class at [facility] [place] . 3rd Wednesday of every month from 7-9pm  . Free  . Register by calling [phone] or online at [email or web address]  . Bring us the certificate from the class to your prenatal appointment  Meet with a midwife at 36 weeks to see if you can still plan a waterbirth and to sign the consent.   If you plan a waterbirth at [facility], you can opt to purchase the following: . Fish Net . Bathing suit top (optional)  . Long-handled mirror (optional)  .  Things that would prevent you from having a waterbirth: . Unknown or Positive COVID-19 diagnosis upon admission to hospital  . Premature, <37wks  . Previous cesarean birth  . Presence of thick meconium-stained fluid  . Multiple gestation (Twins, triplets, etc.)  . Uncontrolled diabetes or gestational diabetes requiring medication  . Hypertension requiring medication or diagnosis of pre-eclampsia  . Heavy vaginal bleeding  . Non-reassuring fetal heart rate  . Active infection (MRSA, etc.). Group B Strep is NOT a contraindication for waterbirth.  . If your labor has to be induced and induction method requires continuous monitoring of the baby's heart rate   . Other risks/issues identified by your obstetrical provider  Please remember that birth is unpredictable. Under certain unforeseeable circumstances your provider may advise against giving birth in the tub. These decisions will be made on a case-by-case basis and with the safety of you and your baby as our highest priority.  **Please remember that in order to have a waterbirth, you must test Negative to COVID-19 upon admission to the hospital.**     

## 2020-02-08 NOTE — Progress Notes (Signed)
LOW-RISK PREGNANCY VISIT Patient name: Peggy [name] MRN [id number]  Date of birth: 04-25-1986 Chief Complaint:   Routine Prenatal Visit  History of Present Illness:   Peggy [name] is a 34 y.o. G51P3003 female at [redacted]w[redacted]d with an Estimated Date of Delivery: 03/11/20 being seen today for ongoing management of a low-risk pregnancy.  Depression screen [facility] 2/9 09/04/2019 07/19/2018  Decreased Interest 0 0  Down, Depressed, Hopeless 0 0  PHQ - 2 Score 0 0    Today she reports irregular contractions, random leaking fluid, none in 2d. Denies abnormal discharge, itching/odor/irritation.  No cocaine 'in a long time'. Contractions: Not present. Vag. Bleeding: None.  Movement: Present. reports leaking of fluid. Review of Systems:   Pertinent items are noted in HPI Denies abnormal vaginal discharge w/ itching/odor/irritation, headaches, visual changes, shortness of breath, chest pain, abdominal pain, severe nausea/vomiting, or problems with urination or bowel movements unless otherwise stated above. Pertinent History Reviewed:  Reviewed past medical,surgical, social, obstetrical and family history.  Reviewed problem list, medications and allergies. Physical Assessment:   Vitals:   02/08/20 1042  BP: 137/84  Pulse: (!) 105  Weight: 232 lb 6.4 oz (105.4 kg)  Body mass index is 37.51 kg/m.        Physical Examination:   General appearance: Well appearing, and in no distress  Mental status: Alert, oriented to person, place, and time  Skin: Warm & dry  Cardiovascular: Normal heart rate noted  Respiratory: Normal respiratory effort, no distress  Abdomen: Soft, gravid, nontender  Pelvic: SSE: cx visually closed, no pooling, no change w/ valsalva, fern & nitrazine neg         Extremities: Edema: Trace  Fetal Status:     Movement: Present    Chaperone: Stoney Bang    Results for orders placed or performed in visit on 02/08/20 (from the past 24 hour(s))  POC Urinalysis Dipstick OB    Collection Time: 02/08/20 10:39 AM  Result Value Ref Range   Color, UA     Clarity, UA     Glucose, UA Negative Negative   Bilirubin, UA     Ketones, UA moderate    Spec Grav, UA     Blood, UA n    pH, UA     POC,PROTEIN,UA Negative Negative, Trace, Small (1+), Moderate (2+), Large (3+), 4+   Urobilinogen, UA     Nitrite, UA n    Leukocytes, UA Negative Negative   Appearance     Odor      Assessment & Plan:  1) Low-risk pregnancy G4P3003 at [redacted]w[redacted]d with an Estimated Date of Delivery: 03/11/20   2) H/O GHTN, bp stable, asymptomatic  3) Interested in waterbirth> has taken class, consent next week  4) Normal vaginal d/c> no evidence of ROM   Meds: No orders of the defined types were placed in this encounter.  Labs/procedures today: spec exam, tdap  Plan:  Continue routine obstetrical care  Next visit: prefers will be in person for waterbirth consent and gbs  Reviewed: Preterm labor symptoms and general obstetric precautions including but not limited to vaginal bleeding, contractions, leaking of fluid and fetal movement were reviewed in detail with the patient.  All questions were answered.   Follow-up: Return in about 1 week (around 02/15/2020) for LROB, CNM, in person.  Orders Placed This Encounter  Procedures  . Tdap vaccine greater than or equal to 7yo IM  . POC Urinalysis Dipstick OB   Cheral Marker CNM, WHNP-BC  02/08/2020 11:08 AM

## 2020-02-14 NOTE — Progress Notes (Signed)
PATIENT ID: Thereasa [name], female     DOB: [birth year], 34 y.o.     MRN: [id number]    LOW-RISK PREGNANCY VISIT PATIENT NAME: Peggy [name] MRN [id number]  DOB: August 05, 1986  Chief Complaint:   Routine Prenatal Visit   History of Present Illness:   Peggy CUNANAN is a 34 y.o. G16P3003 female at [redacted]w[redacted]d with an Estimated Date of Delivery: 03/11/20 being seen today for ongoing management of a low-risk pregnancy.   Depression screen [facility] 2/9 09/04/2019 07/19/2018  Decreased Interest 0 0  Down, Depressed, Hopeless 0 0  PHQ - 2 Score 0 0    Today she reports no complaints. Contractions: Irregular. Vag. Bleeding: None.  Movement: Present. denies leaking of fluid.  Review of Systems:   Pertinent items are noted in HPI Denies abnormal vaginal discharge w/ itching/odor/irritation, headaches, visual changes, shortness of breath, chest pain, abdominal pain, severe nausea/vomiting, or problems with urination or bowel movements unless otherwise stated above.  Pertinent History Reviewed:  Reviewed past medical,surgical, social, obstetrical and family history.  Reviewed problem list, medications and allergies.  Physical Assessment:   Vitals:   02/15/20 1100  BP: 127/88  Pulse: (!) 110  Weight: 229 lb 9.6 oz (104.1 kg)  Body mass index is 37.06 kg/m.        Physical Examination:   General appearance: Well appearing, and in no distress  Mental status: Alert, oriented to person, place, and time  Skin: Warm & dry  Cardiovascular: Normal heart rate noted  Respiratory: Normal respiratory effort, no distress  Abdomen: Soft, gravid, nontender   Baby: 141 bpm   39 cm  Pelvic: Cervical exam performed 2cm dialation 25%/ -2 vertex cervix soft        Extremities: Edema: None  Fetal Status:     Movement: Present    Chaperone: Data processing manager    Results for orders placed or performed in visit on 02/15/20 (from the past 24 hour(s))  POC Urinalysis Dipstick OB   Collection Time: 02/15/20 10:58 AM    Result Value Ref Range   Color, UA     Clarity, UA     Glucose, UA Negative Negative   Bilirubin, UA     Ketones, UA n    Spec Grav, UA     Blood, UA n    pH, UA     POC,PROTEIN,UA Negative Negative, Trace, Small (1+), Moderate (2+), Large (3+), 4+   Urobilinogen, UA     Nitrite, UA n    Leukocytes, UA Negative Negative   Appearance     Odor      Assessment & Plan:  1) Low-risk pregnancy G4P3003 at [redacted]w[redacted]d with an Estimated Date of Delivery: 03/11/20   2) Group B Strep test completed, will call with results   Meds: No orders of the defined types were placed in this encounter.  Labs/procedures today: Group B strep  Plan:  Continue routine obstetrical care Next visit: prefers in person    Reviewed: Term labor symptoms and general obstetric precautions including but not limited to vaginal bleeding, contractions, leaking of fluid and fetal movement were reviewed in detail with the patient.  All questions were answered. Check bp weekly, let us know if >140/90.   Follow-up: Return in about 1 week (around 02/22/2020) for LROB.  By signing my name below, I, [employer], attest that this documentation has been prepared under the direction and in the presence of Tilda Burrow, MD. Electronically Signed: Mal Misty Medical Scribe. 02/15/20.  11:37 AM.  I personally performed the services described in this documentation, which was SCRIBED in my presence. The recorded information has been reviewed and considered accurate. It has been edited as necessary during review. Tilda Burrow, MD

## 2020-02-15 ENCOUNTER — Other Ambulatory Visit: Payer: Self-pay

## 2020-02-15 ENCOUNTER — Ambulatory Visit (INDEPENDENT_AMBULATORY_CARE_PROVIDER_SITE_OTHER): Payer: Medicaid Other | Admitting: Obstetrics and Gynecology

## 2020-02-15 ENCOUNTER — Other Ambulatory Visit (HOSPITAL_COMMUNITY)
Admission: RE | Admit: 2020-02-15 | Discharge: 2020-02-15 | Disposition: A | Discharge status: Home / Self Care | Payer: Medicaid Other | Source: Ambulatory Visit | Attending: Obstetrics and Gynecology | Admitting: Obstetrics and Gynecology

## 2020-02-15 VITALS — BP 127/88 | HR 110 | Wt 229.6 lb

## 2020-02-15 DIAGNOSIS — Z1389 Encounter for screening for other disorder: Secondary | ICD-10-CM

## 2020-02-15 DIAGNOSIS — Z3A36 36 weeks gestation of pregnancy: Secondary | ICD-10-CM | POA: Insufficient documentation

## 2020-02-15 DIAGNOSIS — O09523 Supervision of elderly multigravida, third trimester: Secondary | ICD-10-CM | POA: Insufficient documentation

## 2020-02-15 DIAGNOSIS — Z3483 Encounter for supervision of other normal pregnancy, third trimester: Secondary | ICD-10-CM

## 2020-02-15 DIAGNOSIS — Z331 Pregnant state, incidental: Secondary | ICD-10-CM

## 2020-02-15 DIAGNOSIS — Z113 Encounter for screening for infections with a predominantly sexual mode of transmission: Secondary | ICD-10-CM | POA: Insufficient documentation

## 2020-02-15 LAB — POCT URINALYSIS DIPSTICK OB
Blood, UA: NEGATIVE
Glucose, UA: NEGATIVE
Ketones, UA: NEGATIVE
Leukocytes, UA: NEGATIVE
Nitrite, UA: NEGATIVE
POC,PROTEIN,UA: NEGATIVE

## 2020-02-16 LAB — CERVICOVAGINAL ANCILLARY ONLY
Chlamydia: NEGATIVE
Comment: NEGATIVE
Comment: NORMAL
Neisseria Gonorrhea: NEGATIVE

## 2020-02-17 LAB — STREP GP B NAA: Strep Gp B NAA: NEGATIVE

## 2020-02-22 ENCOUNTER — Encounter: Payer: Self-pay | Admitting: Advanced Practice Midwife

## 2020-02-22 ENCOUNTER — Encounter (HOSPITAL_COMMUNITY): Payer: Self-pay | Admitting: Obstetrics & Gynecology

## 2020-02-22 ENCOUNTER — Ambulatory Visit (INDEPENDENT_AMBULATORY_CARE_PROVIDER_SITE_OTHER): Payer: Medicaid Other | Admitting: Advanced Practice Midwife

## 2020-02-22 ENCOUNTER — Other Ambulatory Visit: Payer: Self-pay

## 2020-02-22 ENCOUNTER — Inpatient Hospital Stay (HOSPITAL_COMMUNITY)
Admission: AD | Admit: 2020-02-22 | Discharge: 2020-02-22 | Disposition: A | Discharge status: Home / Self Care | Payer: Medicaid Other | Attending: Obstetrics & Gynecology | Admitting: Obstetrics & Gynecology

## 2020-02-22 VITALS — BP 131/89 | HR 92 | Wt 231.0 lb

## 2020-02-22 DIAGNOSIS — F1490 Cocaine use, unspecified, uncomplicated: Secondary | ICD-10-CM | POA: Insufficient documentation

## 2020-02-22 DIAGNOSIS — Z331 Pregnant state, incidental: Secondary | ICD-10-CM

## 2020-02-22 DIAGNOSIS — Z1389 Encounter for screening for other disorder: Secondary | ICD-10-CM

## 2020-02-22 DIAGNOSIS — O99323 Drug use complicating pregnancy, third trimester: Secondary | ICD-10-CM | POA: Diagnosis present

## 2020-02-22 DIAGNOSIS — O09523 Supervision of elderly multigravida, third trimester: Secondary | ICD-10-CM

## 2020-02-22 DIAGNOSIS — Z3483 Encounter for supervision of other normal pregnancy, third trimester: Secondary | ICD-10-CM

## 2020-02-22 DIAGNOSIS — Z3A37 37 weeks gestation of pregnancy: Secondary | ICD-10-CM | POA: Insufficient documentation

## 2020-02-22 DIAGNOSIS — O368130 Decreased fetal movements, third trimester, not applicable or unspecified: Secondary | ICD-10-CM | POA: Diagnosis not present

## 2020-02-22 LAB — POCT URINALYSIS DIPSTICK OB
Blood, UA: NEGATIVE
Glucose, UA: NEGATIVE
Ketones, UA: NEGATIVE
Nitrite, UA: NEGATIVE

## 2020-02-22 NOTE — MAU Note (Signed)
After speaking to provider and patient, the patient is going to ambulate for 30 min and come back to room.

## 2020-02-22 NOTE — Progress Notes (Signed)
   LOW-RISK PREGNANCY VISIT Patient name: Peggy [name] MRN [id number]  Date of birth: 1986/03/14 Chief Complaint:   Routine Prenatal Visit (lost mucus plug; decreased baby movement)  History of Present Illness:   Peggy [name] is a 34 y.o. [phone] female at [redacted]w[redacted]d with an Estimated Date of Delivery: 03/11/20 being seen today for ongoing management of a low-risk pregnancy.  Today she reports no complaints. Contractions: Irregular. Vag. Bleeding: None.  Movement: (!) Decreased. denies leaking of fluid. Review of Systems:   Pertinent items are noted in HPI Denies abnormal vaginal discharge w/ itching/odor/irritation, headaches, visual changes, shortness of breath, chest pain, abdominal pain, severe nausea/vomiting, or problems with urination or bowel movements unless otherwise stated above. Pertinent History Reviewed:  Reviewed past medical,surgical, social, obstetrical and family history.  Reviewed problem list, medications and allergies. Physical Assessment:   Vitals:   02/22/20 1109  BP: 131/90  Pulse: 92  Weight: 231 lb (104.8 kg)  Body mass index is 37.28 kg/m.        Physical Examination:   General appearance: Well appearing, and in no distress  Mental status: Alert, oriented to person, place, and time  Skin: Warm & dry  Cardiovascular: Normal heart rate noted  Respiratory: Normal respiratory effort, no distress  Abdomen: Soft, gravid, nontender  Pelvic: Cervical exam performed  Dilation: 2 Effacement (%): 50 Station: -2  Extremities: Edema: None  Fetal Status: Fetal Heart Rate (bpm): 135 Fundal Height: 37 cm Movement: (!) Decreased Presentation: Vertex  Chaperone: catie Lendell Caprice, NP-s    Results for orders placed or performed in visit on 02/22/20 (from the past 24 hour(s))  POC Urinalysis Dipstick OB   Collection Time: 02/22/20 11:11 AM  Result Value Ref Range   Color, UA     Clarity, UA     Glucose, UA Negative Negative   Bilirubin, UA     Ketones, UA neg    Spec  Grav, UA     Blood, UA neg    pH, UA     POC,PROTEIN,UA Trace Negative, Trace, Small (1+), Moderate (2+), Large (3+), 4+   Urobilinogen, UA     Nitrite, UA neg    Leukocytes, UA Trace (A) Negative   Appearance     Odor      Assessment & Plan:  1) Low-risk pregnancy G4P3003 at [redacted]w[redacted]d with an Estimated Date of Delivery: 03/11/20   2) Decreased FM, active on EFM, felt by pt   Meds: No orders of the defined types were placed in this encounter.  Labs/procedures today: NST: FHR baseline 145 bpm, Variability: moderate, Accelerations:present, Decelerations:  Absent= Cat 1/Reactive   Plan:  Continue routine obstetrical care waterbirth consent signed today Next visit: prefers in person    Reviewed: Term labor symptoms and general obstetric precautions including but not limited to vaginal bleeding, contractions, leaking of fluid and fetal movement were reviewed in detail with the patient.  All questions were answered. Has home bp cuff. Check bp weekly, let us know if >140/90. Has not had any >140/90 at home.   Follow-up: Return in about 1 week (around 02/29/2020) for LROB.  Orders Placed This Encounter  Procedures  . POC Urinalysis Dipstick OB   Jacklyn Shell DNP, CNM 02/22/2020 11:39 AM

## 2020-02-22 NOTE — MAU Note (Signed)
went to the dr earlier this morning, was fine then, but after they checked her she started having pains.  2/50.no bleeding, no leaking.  They are not as often as they were.

## 2020-02-22 NOTE — Patient Instructions (Signed)

## 2020-02-27 ENCOUNTER — Inpatient Hospital Stay (HOSPITAL_COMMUNITY)
Admission: AD | Admit: 2020-02-27 | Discharge: 2020-02-29 | DRG: 798 | Disposition: A | Discharge status: Home / Self Care | Payer: Medicaid Other | Attending: Family Medicine | Admitting: Family Medicine

## 2020-02-27 ENCOUNTER — Encounter (HOSPITAL_COMMUNITY): Payer: Self-pay | Admitting: Obstetrics and Gynecology

## 2020-02-27 ENCOUNTER — Other Ambulatory Visit: Payer: Self-pay

## 2020-02-27 DIAGNOSIS — O09523 Supervision of elderly multigravida, third trimester: Secondary | ICD-10-CM

## 2020-02-27 DIAGNOSIS — F17210 Nicotine dependence, cigarettes, uncomplicated: Secondary | ICD-10-CM | POA: Diagnosis present

## 2020-02-27 DIAGNOSIS — O99214 Obesity complicating childbirth: Secondary | ICD-10-CM | POA: Diagnosis present

## 2020-02-27 DIAGNOSIS — Z3A38 38 weeks gestation of pregnancy: Secondary | ICD-10-CM

## 2020-02-27 DIAGNOSIS — E669 Obesity, unspecified: Secondary | ICD-10-CM | POA: Diagnosis present

## 2020-02-27 DIAGNOSIS — O99334 Smoking (tobacco) complicating childbirth: Secondary | ICD-10-CM | POA: Diagnosis present

## 2020-02-27 DIAGNOSIS — Z20822 Contact with and (suspected) exposure to covid-19: Secondary | ICD-10-CM | POA: Diagnosis present

## 2020-02-27 DIAGNOSIS — F1490 Cocaine use, unspecified, uncomplicated: Secondary | ICD-10-CM | POA: Diagnosis present

## 2020-02-27 DIAGNOSIS — O134 Gestational [pregnancy-induced] hypertension without significant proteinuria, complicating childbirth: Principal | ICD-10-CM | POA: Diagnosis present

## 2020-02-27 DIAGNOSIS — O99320 Drug use complicating pregnancy, unspecified trimester: Secondary | ICD-10-CM

## 2020-02-27 DIAGNOSIS — O09529 Supervision of elderly multigravida, unspecified trimester: Secondary | ICD-10-CM

## 2020-02-27 DIAGNOSIS — O26893 Other specified pregnancy related conditions, third trimester: Secondary | ICD-10-CM | POA: Diagnosis present

## 2020-02-27 DIAGNOSIS — Z3490 Encounter for supervision of normal pregnancy, unspecified, unspecified trimester: Secondary | ICD-10-CM

## 2020-02-27 DIAGNOSIS — Z3483 Encounter for supervision of other normal pregnancy, third trimester: Secondary | ICD-10-CM

## 2020-02-27 DIAGNOSIS — Z8632 Personal history of gestational diabetes: Secondary | ICD-10-CM | POA: Diagnosis present

## 2020-02-27 DIAGNOSIS — Z8759 Personal history of other complications of pregnancy, childbirth and the puerperium: Secondary | ICD-10-CM

## 2020-02-27 DIAGNOSIS — O139 Gestational [pregnancy-induced] hypertension without significant proteinuria, unspecified trimester: Secondary | ICD-10-CM

## 2020-02-27 LAB — COMPREHENSIVE METABOLIC PANEL
ALT: 15 U/L (ref 0–44)
AST: 20 U/L (ref 15–41)
Albumin: 2.8 g/dL — ABNORMAL LOW (ref 3.5–5.0)
Alkaline Phosphatase: 202 U/L — ABNORMAL HIGH (ref 38–126)
Anion gap: 11 (ref 5–15)
BUN: <5 mg/dL — ABNORMAL LOW (ref 6–20)
CO2: 20 mmol/L — ABNORMAL LOW (ref 22–32)
Calcium: 8.9 mg/dL (ref 8.9–10.3)
Chloride: 108 mmol/L (ref 98–111)
Creatinine, Ser: 0.51 mg/dL (ref 0.44–1.00)
GFR calc Af Amer: >60 mL/min (ref >60)
GFR calc non Af Amer: >60 mL/min (ref >60)
Glucose, Bld: 87 mg/dL (ref 70–99)
Potassium: 3.7 mmol/L (ref 3.5–5.1)
Sodium: 139 mmol/L (ref 135–145)
Total Bilirubin: 0.8 mg/dL (ref 0.3–1.2)
Total Protein: 5.8 g/dL — ABNORMAL LOW (ref 6.5–8.1)

## 2020-02-27 LAB — TYPE AND SCREEN
ABO/RH(D): O POS
Antibody Screen: NEGATIVE

## 2020-02-27 LAB — CBC
HCT: 34.3 % — ABNORMAL LOW (ref 36.0–46.0)
Hemoglobin: 11.9 g/dL — ABNORMAL LOW (ref 12.0–15.0)
MCH: 28.3 pg (ref 26.0–34.0)
MCHC: 34.7 g/dL (ref 30.0–36.0)
MCV: 81.5 fL (ref 80.0–100.0)
Platelets: 149 10*3/uL — ABNORMAL LOW (ref 150–400)
RBC: 4.21 MIL/uL (ref 3.87–5.11)
RDW: 12.4 % (ref 11.5–15.5)
WBC: 8 10*3/uL (ref 4.0–10.5)
nRBC: 0 % (ref 0.0–0.2)

## 2020-02-27 LAB — RAPID URINE DRUG SCREEN, HOSP PERFORMED
Amphetamines: NOT DETECTED
Barbiturates: NOT DETECTED
Benzodiazepines: NOT DETECTED
Cocaine: NOT DETECTED
Opiates: NOT DETECTED
Tetrahydrocannabinol: NOT DETECTED

## 2020-02-27 LAB — SARS CORONAVIRUS 2 BY RT PCR (HOSPITAL ORDER, PERFORMED IN ~~LOC~~ HOSPITAL LAB): SARS Coronavirus 2: NEGATIVE

## 2020-02-27 LAB — PROTEIN / CREATININE RATIO, URINE
Creatinine, Urine: 305.26 mg/dL
Protein Creatinine Ratio: 0.09 mg/mg{Cre} (ref 0.00–0.15)
Total Protein, Urine: 27 mg/dL

## 2020-02-27 MED ORDER — ACETAMINOPHEN 325 MG PO TABS: 650.0000 mg | ORAL_TABLET | ORAL | Status: DC | PRN

## 2020-02-27 MED ORDER — DIBUCAINE (PERIANAL) 1 % EX OINT: 1.0000 "application " | TOPICAL_OINTMENT | CUTANEOUS | Status: DC | PRN

## 2020-02-27 MED ORDER — ONDANSETRON HCL 4 MG/2ML IJ SOLN: 4.0000 mg | Freq: Four times a day (QID) | INTRAMUSCULAR | Status: DC | PRN

## 2020-02-27 MED ORDER — ONDANSETRON HCL 4 MG PO TABS: 4.0000 mg | ORAL_TABLET | ORAL | Status: DC | PRN

## 2020-02-27 MED ORDER — COCONUT OIL OIL: 1.0000 "application " | TOPICAL_OIL | Status: DC | PRN

## 2020-02-27 MED ORDER — FENTANYL CITRATE (PF) 100 MCG/2ML IJ SOLN
INTRAMUSCULAR | Status: AC
  Administered 2020-02-27: 100 ug
  Filled 2020-02-27: qty 2

## 2020-02-27 MED ORDER — SENNOSIDES-DOCUSATE SODIUM 8.6-50 MG PO TABS
2.0000 | ORAL_TABLET | ORAL | Status: DC
  Administered 2020-02-27 – 2020-02-28 (×2): 2 via ORAL
  Filled 2020-02-27 (×2): qty 2

## 2020-02-27 MED ORDER — OXYTOCIN BOLUS FROM INFUSION
333.0000 mL | Freq: Once | INTRAVENOUS | Status: AC
  Administered 2020-02-27: 333 mL via INTRAVENOUS

## 2020-02-27 MED ORDER — MEASLES, MUMPS & RUBELLA VAC IJ SOLR: 0.5000 mL | Freq: Once | INTRAMUSCULAR | Status: DC

## 2020-02-27 MED ORDER — DIPHENHYDRAMINE HCL 25 MG PO CAPS: 25.0000 mg | ORAL_CAPSULE | Freq: Four times a day (QID) | ORAL | Status: DC | PRN

## 2020-02-27 MED ORDER — TETANUS-DIPHTH-ACELL PERTUSSIS 5-2.5-18.5 LF-MCG/0.5 IM SUSP: 0.5000 mL | Freq: Once | INTRAMUSCULAR | Status: DC

## 2020-02-27 MED ORDER — ACETAMINOPHEN 325 MG PO TABS
650.0000 mg | ORAL_TABLET | Freq: Four times a day (QID) | ORAL | Status: DC | PRN
  Administered 2020-02-27 – 2020-02-29 (×4): 650 mg via ORAL
  Filled 2020-02-27 (×4): qty 2

## 2020-02-27 MED ORDER — IBUPROFEN 600 MG PO TABS
600.0000 mg | ORAL_TABLET | Freq: Three times a day (TID) | ORAL | Status: DC | PRN
  Administered 2020-02-27 – 2020-02-28 (×4): 600 mg via ORAL
  Filled 2020-02-27 (×4): qty 1

## 2020-02-27 MED ORDER — SIMETHICONE 80 MG PO CHEW: 80.0000 mg | CHEWABLE_TABLET | ORAL | Status: DC | PRN

## 2020-02-27 MED ORDER — LACTATED RINGERS IV SOLN: 500.0000 mL | INTRAVENOUS | Status: DC | PRN

## 2020-02-27 MED ORDER — PRENATAL MULTIVITAMIN CH
1.0000 | ORAL_TABLET | Freq: Every day | ORAL | Status: DC
  Administered 2020-02-28: 1 via ORAL
  Filled 2020-02-27: qty 1

## 2020-02-27 MED ORDER — LACTATED RINGERS IV SOLN: INTRAVENOUS | Status: DC

## 2020-02-27 MED ORDER — MISOPROSTOL 50MCG HALF TABLET
50.0000 ug | ORAL_TABLET | Freq: Once | ORAL | Status: AC
  Administered 2020-02-27: 50 ug via BUCCAL
  Filled 2020-02-27: qty 1

## 2020-02-27 MED ORDER — ONDANSETRON HCL 4 MG/2ML IJ SOLN: 4.0000 mg | INTRAMUSCULAR | Status: DC | PRN

## 2020-02-27 MED ORDER — BENZOCAINE-MENTHOL 20-0.5 % EX AERO
1.0000 "application " | INHALATION_SPRAY | CUTANEOUS | Status: DC | PRN
  Administered 2020-02-27: 1 via TOPICAL
  Filled 2020-02-27: qty 56

## 2020-02-27 MED ORDER — FENTANYL CITRATE (PF) 100 MCG/2ML IJ SOLN: 100.0000 ug | Freq: Once | INTRAMUSCULAR | Status: AC

## 2020-02-27 MED ORDER — LIDOCAINE HCL (PF) 1 % IJ SOLN
30.0000 mL | INTRAMUSCULAR | Status: DC | PRN
  Filled 2020-02-27: qty 30

## 2020-02-27 MED ORDER — SOD CITRATE-CITRIC ACID 500-334 MG/5ML PO SOLN: 30.0000 mL | ORAL | Status: DC | PRN

## 2020-02-27 MED ORDER — OXYTOCIN-SODIUM CHLORIDE 30-0.9 UT/500ML-% IV SOLN
2.5000 [IU]/h | INTRAVENOUS | Status: DC
  Filled 2020-02-27: qty 500

## 2020-02-27 MED ORDER — WITCH HAZEL-GLYCERIN EX PADS
1.0000 "application " | MEDICATED_PAD | CUTANEOUS | Status: DC | PRN
  Administered 2020-02-28: 1 via TOPICAL

## 2020-02-27 NOTE — MAU Note (Signed)
Contracting all night. No bleeding or leaking

## 2020-02-27 NOTE — Progress Notes (Signed)
Labor Progress Note Peggy [name] is a 34 y.o. [phone] at [redacted]w[redacted]d presented for early labor and admitted for gHTN. S: Despite AROM, feeling infrequent ctx and comfortable.   O:  BP 128/78   Pulse 91   Temp 98.1 F (36.7 C) (Oral)   Resp 18   Ht 5\' 6"  (1.676 m)   Wt 106.6 kg   LMP 06/05/2019 (Within Days)   SpO2 98%   BMI 37.93 kg/m  EFM: 135, moderate variability, pos accels, no decels, reactive TOCO: q6-48m  CVE: Dilation: 4 Effacement (%): 70 Cervical Position: Posterior Station: -2 Presentation: Vertex Exam by:: DR Malyna Budney   A&P: 34 y.o. 31 [redacted]w[redacted]d here for early labor and admitted for gHTN. #Labor: Progressing well. S/p AROM with infrequent ctx. Patient declines Pitocin at this time but open to Cytotec. Cytotec given. Anticipate SVD. #Pain: per patient request #FWB: Cat I #GBS negative #gHTN: Asymptomatic, normal to mild range BP's  [redacted]w[redacted]d, MD 1:40 PM

## 2020-02-27 NOTE — H&P (Addendum)
OBSTETRIC ADMISSION HISTORY AND PHYSICAL  Peggy [name] is a 34 y.o. female [phone] with IUP at [redacted]w[redacted]d by LMP c/w 9 wk [place] presenting for early labor; reports ctx all night but now spaced out. She reports +FMs, No LOF, no VB, no blurry vision, headaches or peripheral edema, and RUQ pain.  She plans on breast and bottle feeding. She requests IUD outpt for birth control. She received her prenatal care at [facility]   Dating: By LMP c/w 9 wk [place] --->  Estimated Date of Delivery: 03/11/20  Sono: 3/8  [place] 20 wks,cephalic,cx 4 cm,fhr 152 bpm,svp of fluid 5 cm,posterior placenta gr 0,normal ovaries,EFW 415 g 97%,anatomy complete  Prenatal History/Complications: AMA Hx gHTN UDS positive for cocaine and THC in Jan 2021 EFW 97% at 20 weeks Maternal Obesity  Past Medical History: Past Medical History:  Diagnosis Date  . Gestational diabetes    metformin  . Medical history non-contributory   . Pregnancy induced hypertension     Past Surgical History: Past Surgical History:  Procedure Laterality Date  . COLPOSCOPY      Obstetrical History: OB History    Gravida  4   Para  3   Term  3   Preterm      AB      Living  3     SAB      TAB      Ectopic      Multiple      Live Births  3           Social History: Social History   Socioeconomic History  . Marital status: Single    Spouse name: Not on file  . Number of children: 3  . Years of education: Not on file  . Highest education level: Not on file  Occupational History  . Not on file  Tobacco Use  . Smoking status: Current Every Day Smoker    Packs/day: 0.25    Types: Cigarettes  . Smokeless tobacco: Never Used  Vaping Use  . Vaping Use: Never used  Substance and Sexual Activity  . Alcohol use: No  . Drug use: No  . Sexual activity: Yes    Birth control/protection: None  Other Topics Concern  . Not on file  Social History Narrative  . Not on file   Social Determinants of Health   Financial  Resource Strain: Low Risk   . Difficulty of Paying Living Expenses: Not very hard  Food Insecurity: No Food Insecurity  . Worried About Programme researcher, broadcasting/film/video in the Last Year: Never true  . Ran Out of Food in the Last Year: Never true  Transportation Needs: No Transportation Needs  . Lack of Transportation (Medical): No  . Lack of Transportation (Non-Medical): No  Physical Activity: Inactive  . Days of Exercise per Week: 0 days  . Minutes of Exercise per Session: 0 min  Stress: No Stress Concern Present  . Feeling of Stress : Not at all  Social Connections: Moderately Integrated  . Frequency of Communication with Friends and Family: Three times a week  . Frequency of Social Gatherings with Friends and Family: Three times a week  . Attends Religious Services: More than 4 times per year  . Active Member of Clubs or Organizations: No  . Attends Banker Meetings: Never  . Marital Status: Living with partner    Family History: Family History  Problem Relation Age of Onset  . Hypertension Mother   .  Asthma Daughter   . Asthma Son   . Diabetes Maternal Grandmother   . Other Son        RSV    Allergies: Allergies  Allergen Reactions  . Aspirin Swelling    ORAL SWELLING. Pt has tolerated ibuprofen in the past    Medications Prior to Admission  Medication Sig Dispense Refill Last Dose  . Prenat-FeCbn-FeAsp-Meth-FA-DHA (PRENATE MINI) 18-0.6-0.4-350 MG CAPS Take 1 capsule by mouth daily. 30 capsule 11 Past Week at Unknown time  . Blood Pressure Monitor MISC For regular home bp monitoring during pregnancy 1 each 0   . pantoprazole (PROTONIX) 20 MG tablet Take 1 tablet (20 mg total) by mouth daily. (Patient taking differently: Take 20 mg by mouth as needed. ) 30 tablet 3   . promethazine (PHENERGAN) 25 MG tablet Take 1 tablet (25 mg total) by mouth every 6 (six) hours as needed for nausea or vomiting. (Patient not taking: Reported on 12/18/2019) 30 tablet 1      Review of  Systems   All systems reviewed and negative except as stated in HPI  Blood pressure 113/85, pulse 90, temperature 98.1 F (36.7 C), temperature source Oral, resp. rate 18, height 5\' 6"  (1.676 m), weight 106.6 kg, last menstrual period 06/05/2019, SpO2 98 %, not currently breastfeeding. General appearance: alert, cooperative and no distress Lungs: normal effort Heart: regular rate  Abdomen: soft, non-tender; bowel sounds normal Pelvic: gravid uterus Extremities: Homans sign is negative, no sign of DVT Presentation: cephalic Fetal monitoringBaseline: 135 bpm, Variability: Good {> 6 bpm), Accelerations: Reactive and Decelerations: Absent Uterine activity: infrequent Dilation: 3.5 Effacement (%): 70 Station: -2 Exam by:: [email or web address], RN   Prenatal labs: ABO, Rh: --/--/PENDING (07/13 1021) Antibody: PENDING (07/13 1021) Rubella: 7.83 (01/18 1053) RPR: Non Reactive (05/03 0826)  HBsAg: Negative (01/18 1053)  HIV: Non Reactive (05/03 0826)  GBS: Negative/-- (07/01 1430)  2 hr Glucola WNL Genetic screening  Low risk female Anatomy 10-22-2005 normal  Prenatal Transfer Tool  Maternal Diabetes: No Genetic Screening: Normal Maternal Ultrasounds/Referrals: Normal Fetal Ultrasounds or other Referrals:  None Maternal Substance Abuse:  UDS positive for cocaine and THC in Jan 2021 Significant Maternal Medications:  None Significant Maternal Lab Results: Group B Strep negative  Results for orders placed or performed during the hospital encounter of 02/27/20 (from the past 24 hour(s))  CBC   Collection Time: 02/27/20 10:21 AM  Result Value Ref Range   WBC 8.0 4.0 - 10.5 K/uL   RBC 4.21 3.87 - 5.11 MIL/uL   Hemoglobin 11.9 (L) 12.0 - 15.0 g/dL   HCT 02/29/20 (L) 36 - 46 %   MCV 81.5 80.0 - 100.0 fL   MCH 28.3 26.0 - 34.0 pg   MCHC 34.7 30.0 - 36.0 g/dL   RDW 56.3 14.9 - 70.2 %   Platelets 149 (L) 150 - 400 K/uL   nRBC 0.0 0.0 - 0.2 %  Type and screen [facility]   Collection  Time: 02/27/20 10:21 AM  Result Value Ref Range   ABO/RH(D) PENDING    Antibody Screen PENDING    Sample Expiration      03/01/2020,2359 Performed at [facility], [address] [place], [place]     Patient Active Problem List   Diagnosis Date Noted  . Cocaine use complicating pregnancy 09/05/2019  . Supervision of normal pregnancy 09/04/2019  . History of pregnancy induced hypertension 09/04/2019  . Advanced maternal age in multigravida 09/04/2019  .  History of gestational diabetes 01/14/2018  . Vitamin D deficiency 09/08/2017    Assessment/Plan:  TEYAH ROSSY is a 34 y.o. [phone] at [redacted]w[redacted]d here for early labor and will augment for gHTN vs Pre-E.  #Labor: Vertex by RN exam. AROM with clear fluid upon arrival to L&D. Will start Pit in a few hours if no progress. D/w Dr. Adrian Blackwater and nursing staff; no waterbirth due to [facility]. Anticipate SVD.  #Pain: Per patient request #FWB: Cat I; EFW: 4000g (proven to 4054g) #ID:  GBS neg #MOF: Both #MOC: IUD outpt #Circ:  Inpatient #Elevated BP's: Hx gHTN. Diastolic 90 in office on 7/8 and initial BP elevated today. PCR and CMP pending. Asymptomatic.  #Previously positive for cocaine and THS: repeat UDS pending   Jerilynn Birkenhead, MD [facility] Fellow, [facility] for [facility], [facility] 02/27/2020, 11:18 AM

## 2020-02-27 NOTE — Discharge Summary (Signed)
Postpartum Discharge Summary    Patient Name: Peggy [name] DOB: 16-Sep-1985 MRN: [id number]  Date of admission: 02/27/2020 Delivery date:02/27/2020  Delivering provider: Chauncey Mann  Date of discharge: 02/29/2020  Admitting diagnosis: Labor and delivery, indication for care [O75.9] Intrauterine pregnancy: [redacted]w[redacted]d    Secondary diagnosis:  Active Problems:   History of gestational diabetes   Gestational hypertension   Supervision of normal pregnancy   History of pregnancy induced hypertension   Advanced maternal age in multigravida   Cocaine use complicating pregnancy   Labor and delivery, indication for care  Additional problems: None    Discharge diagnosis: Term Pregnancy Delivered and Gestational Hypertension                                              Post partum procedures:None Augmentation: AROM and Cytotec Complications: None  Hospital course: Onset of Labor With Vaginal Delivery      34y.o. yo [id number] admitted in Latent Labor with gHTN on 02/27/2020. Patient had an uncomplicated labor course as follows: Initial SVE: 3.5/70/-2. Patient received AROM and Cytotec. She then progressed to complete.  Membrane Rupture Time/Date: 11:35 AM ,02/27/2020   Delivery Method:Vaginal, Spontaneous  Episiotomy: None  Lacerations:  None  Patient had an uncomplicated postpartum course. BP's monitored and normal. She is ambulating, tolerating a regular diet, passing flatus, and urinating well. Patient is discharged home in stable condition on 02/29/20.  Newborn Data: Birth date:02/27/2020  Birth time:6:11 PM  Gender:Female  Living status:Living  Apgars:8 ,9  Weight:3810 g   Magnesium Sulfate received: No BMZ received: No Rhophylac:N/A MMR:N/A T-DaP:Given prenatally Flu: No Transfusion:No  Physical exam  Vitals:   02/28/20 1013 02/28/20 1459 02/28/20 2005 02/29/20 0511  BP: 117/84 134/84 134/78 124/82  Pulse: 91 (!) 101 91 89  Resp: '20 18 18 18  ' Temp: 97.6 F  (36.4 C) 98.5 F (36.9 C) 99 F (37.2 C) 98.2 F (36.8 C)  TempSrc: Oral Oral Oral Oral  SpO2: 99% 100% 100% 100%  Weight:      Height:       General: alert, cooperative and no distress Lochia: appropriate Uterine Fundus: firm Incision: N/A DVT Evaluation: No evidence of DVT seen on physical exam. No significant calf/ankle edema. Labs: Lab Results  Component Value Date   WBC 8.0 02/27/2020   HGB 11.9 (L) 02/27/2020   HCT 34.3 (L) 02/27/2020   MCV 81.5 02/27/2020   PLT 149 (L) 02/27/2020   CMP Latest Ref Rng & Units 02/27/2020  Glucose 70 - 99 mg/dL 87  BUN 6 - 20 mg/dL <5(L)  Creatinine 0.44 - 1.00 mg/dL 0.51  Sodium 135 - 145 mmol/L 139  Potassium 3.5 - 5.1 mmol/L 3.7  Chloride 98 - 111 mmol/L 108  CO2 22 - 32 mmol/L 20(L)  Calcium 8.9 - 10.3 mg/dL 8.9  Total Protein 6.5 - 8.1 g/dL 5.8(L)  Total Bilirubin 0.3 - 1.2 mg/dL 0.8  Alkaline Phos 38 - 126 U/L 202(H)  AST 15 - 41 U/L 20  ALT 0 - 44 U/L 15   Edinburgh Score: Edinburgh Postnatal Depression Scale Screening Tool 02/27/2020  I have been able to laugh and see the funny side of things. 0  I have looked forward with enjoyment to things. 0  I have blamed myself unnecessarily when things went wrong. 1  I have been  anxious or worried for no good reason. 0  I have felt scared or panicky for no good reason. 0  Things have been getting on top of me. 0  I have been so unhappy that I have had difficulty sleeping. 0  I have felt sad or miserable. 0  I have been so unhappy that I have been crying. 0  The thought of harming myself has occurred to me. 0  Edinburgh Postnatal Depression Scale Total 1     After visit meds:  Allergies as of 02/29/2020      Reactions   Aspirin Swelling   ORAL SWELLING. Pt has tolerated ibuprofen in the past      Medication List    STOP taking these medications   promethazine 25 MG tablet Commonly known as: PHENERGAN     TAKE these medications   acetaminophen 325 MG  tablet Commonly known as: Tylenol Take 2 tablets (650 mg total) by mouth every 6 (six) hours as needed (for pain scale < 4).   Blood Pressure Monitor Misc For regular home bp monitoring during pregnancy   ibuprofen 600 MG tablet Commonly known as: ADVIL Take 1 tablet (600 mg total) by mouth every 8 (eight) hours as needed for mild pain.   pantoprazole 20 MG tablet Commonly known as: Protonix Take 1 tablet (20 mg total) by mouth daily. What changed:   when to take this  reasons to take this   polyethylene glycol powder 17 GM/SCOOP powder Commonly known as: GLYCOLAX/MIRALAX Take 17 g by mouth daily as needed.   Prenate Mini 18-0.6-0.4-350 MG Caps Take 1 capsule by mouth daily.        Discharge home in stable condition Infant Feeding: Bottle and Breast Infant Disposition:home with mother Discharge instruction: per After Visit Summary and Postpartum booklet. Activity: Advance as tolerated. Pelvic rest for 6 weeks.  Diet: routine diet Future Appointments: Future Appointments  Date Time Provider [place]  03/06/2020  2:50 PM [facility] NURSE [facility]  04/03/2020  1:30 PM Myrtis Ser, CNM [facility]   Follow up Visit:   Please schedule this patient for a In person postpartum visit in 4 weeks with the following provider: Any provider. Additional Postpartum F/U:BP check 1 week and [facility]UD placement at [facility] visit  High risk pregnancy complicated by: HTN Delivery mode:  Vaginal, Spontaneous  Anticipated Birth Control:  [facility]UD outpatient at [facility]   02/29/2020 Clarnce Flock, MD

## 2020-02-28 LAB — RPR: RPR Ser Ql: NONREACTIVE

## 2020-02-28 NOTE — Progress Notes (Addendum)
POSTPARTUM PROGRESS NOTE  Subjective: Peggy [name] is a 34 y.o. [id number] s/p VD at [redacted]w[redacted]d.  She reports she doing well. No acute events overnight. She denies any problems with ambulating, voiding or po intake. Denies nausea or vomiting. She has passed flatus. Pain is moderately controlled.  Lochia is appropriate. She has a court date tomorrow and may need an excuse note if she is to stay another night.  Objective: Blood pressure 128/88, pulse 90, temperature 98.4 F (36.9 C), resp. rate 18, height 5\' 6"  (1.676 m), weight 106.6 kg, last menstrual period 06/05/2019, SpO2 99 %, unknown if currently breastfeeding.  Physical Exam:  General: alert, cooperative and no distress Chest: no respiratory distress Abdomen: soft, non-tender  Uterine Fundus: firm, appropriately tender Extremities: No calf swelling or tenderness  no edema  Recent Labs    02/27/20 1021  HGB 11.9*  HCT 34.3*    Assessment/Plan: Peggy [name] is a 34 y.o. 31 s/p VD at [redacted]w[redacted]d.  Routine Postpartum Care: Doing well, pain well-controlled.  -- Continue routine care, lactation support  -- Contraception: Out-patient IUD -- Feeding: Breast  #Elevated BP's: Hx gHTN. Asymptomatic, in the last 24 hours highest BP has been 142/91. Protein/cr: 0.09. Asymptomatic.  #Previously positive for cocaine and THS: negative 7/13  Dispo: Plan for discharge tomorrow.  8/13, DO PGY1 Family Medicine Resident   GME ATTESTATION:  I saw and evaluated the patient. I agree with the findings and the plan of care as documented in the resident's note. I also discussed risks/benefits of circumcision with the patient- she would like her son to be circumcised when cleared by peds.  Evelena Leyden, DO OB Fellow, Faculty [facility], Center for [facility] 02/28/2020 6:48 AM

## 2020-02-28 NOTE — Clinical Social Work Maternal (Addendum)
CLINICAL SOCIAL WORK MATERNAL/CHILD NOTE  Patient Details  Name: JADENE STEMMER MRN: [id number] Date of Birth: [birth year]  Date:  02/28/2020  Clinical Social Worker Initiating Note:  Elijio Miles Date/Time: Initiated:  02/28/20/0934     Child's Name:  Etheleen Sia   Biological Parents:  Mother, Father Denman George DOB: 08/31/1993)   Need for Interpreter:  None   Reason for Referral:  Current Substance Use/Substance Use During Pregnancy     Address:  [address] [place] [address] (mailing address)  [address], [place]  [address]   Phone number:  [phone] (home)     Additional phone number:   Household Members/Support Persons (HM/SP):   Household Member/Support Person 1, Household Member/Support Person 2, Household Member/Support Person 3, Household Member/Support Person 4   HM/SP Name Relationship DOB or Age  HM/SP -1 Denman George FOB 08/31/1993  HM/SP -2 Jhasir Donnetta Hutching 03/17/2018  HM/SP -3 Ke[place]leigh [place] Daughter 10/03/2017  HM/SP -4 Sharmaine Base. Son 03/14/2006  HM/SP -5        HM/SP -6        HM/SP -7        HM/SP -8          Natural Supports (not living in the home):  Parent, Spouse/significant other   Professional Supports: None   Employment: Unemployed   Type of Work:     Education:  Tool arranged:    Museum/gallery curator Resources:  [employer]   Other Resources:  Physicist, medical  , [place] Considerations Which May Impact Care:    Strengths:  Ability to meet basic needs  , Home prepared for child  , Pediatrician chosen   Psychotropic Medications:         Pediatrician:    [employer]  Pediatrician List:   [place] Other  [place]      Pediatrician Fax Number:    Risk Factors/Current Problems:  Substance Use     Cognitive State:  Alert  , Able to  Concentrate  , Linear Thinking     Mood/Affect:  Calm  , Comfortable  , Interested     CSW Assessment:  CSW received consult for [facility] and cocaine use during pregnancy.  CSW met with MOB to offer support and complete assessment.    MOB resting in bed with infant placed beside her, when CSW entered the room. CSW introduced self and explained reason for visit to which MOB expressed understanding. MOB reported she currently lives with FOB and 2 of her children. Per MOB, her oldest son stays with his grandmother Thornton Papas) most of the time. MOB reported she has custody of all of her children. CSW inquired about MOB's mental health history to which MOB denied having any. CSW provided education regarding the baby blues period vs. perinatal mood disorders, discussed treatment and gave resources for mental health follow up if concerns arise. CSW recommended self-evaluation during the postpartum time period using the New Mom Checklist from Postpartum Progress and encouraged MOB to contact a medical professional if symptoms are noted at any time. MOB denied any current SI, HI or DV and reported having good support from her parents and FOB. MOB reported having all essential items for infant once discharged and stated infant would be sleeping in a bassinet once home. CSW provided review of Sudden Infant Death  Syndrome (SIDS) precautions and safe sleeping habits.    CSW inquired about MOB's substance use during pregnancy. MOB acknowledged use of cocaine early on in her pregnancy but reported she stopped use. MOB unable to recall last use just that it was "early on". CSW inquired about marijuana use to which MOB stated she doesn't usually smoke marijuana unless socially but again stated last use was "early on". CSW informed MOB of Hospital Drug Policy and explained UDS and CDS were still pending but that CPS report would be made, if warranted. CSW explained what process may look like, at MOB's request. CSW asked if  MOB had any previous CPS history to which MOB reported she "didn't think so". CSW spoke with [place] who confirmed MOB doesn't have any current open cases.   CSW to continue to monitor UDS and CDS results and make [facility] CPS report, if warranted.  CSW Plan/Description:  No Further Intervention Required/No Barriers to Discharge, Sudden Infant Death Syndrome (SIDS) Education, Perinatal Mood and Anxiety Disorder (PMADs) Education, [place], CSW Will Continue to Monitor Umbilical Cord Tissue Drug Screen Results and Make Report if Foye Spurling, LCSW 02/28/2020, 9:51 AM

## 2020-02-29 MED ORDER — POLYETHYLENE GLYCOL 3350 17 GM/SCOOP PO POWD
17.0000 g | Freq: Every day | ORAL | 1 refills | Status: DC | PRN
Start: 2020-02-29 — End: 2020-03-13

## 2020-02-29 MED ORDER — ACETAMINOPHEN 325 MG PO TABS: 650.0000 mg | ORAL_TABLET | Freq: Four times a day (QID) | ORAL | 0 refills | Status: AC | PRN

## 2020-02-29 MED ORDER — IBUPROFEN 600 MG PO TABS: 600.0000 mg | ORAL_TABLET | Freq: Three times a day (TID) | ORAL | 0 refills | Status: DC | PRN

## 2020-02-29 NOTE — Discharge Instructions (Signed)

## 2020-03-01 ENCOUNTER — Telehealth: Payer: Self-pay | Admitting: *Deleted

## 2020-03-01 ENCOUNTER — Encounter: Payer: Medicaid Other | Admitting: Advanced Practice Midwife

## 2020-03-01 NOTE — Telephone Encounter (Signed)
Unable to reach patient by phone for [facility] Managed Care transitional care management call.   Plan: Telephone outreach over the next 72 hours  Marja Kays [facility]  [facility] Care Management Coordinator Direct Dial:  [phone]  Fax: [phone]

## 2020-03-04 ENCOUNTER — Telehealth: Payer: Self-pay | Admitting: *Deleted

## 2020-03-04 NOTE — Telephone Encounter (Signed)
°  Transition Care Management Follow-up Telephone Call   [facility] Managed Care Transition Call Status:MM [facility] Call Made   Date of discharge and from where: [facility] 02/29/20   How have you been since you were released from the hospital? ok   Any questions or concerns? No  Items Reviewed:  Did the pt receive and understand the discharge instructions provided? Yes   Medications obtained and verified? Yes   Any new allergies since your discharge? No   Dietary orders reviewed? Yes  Do you have support at home? yes Functional Questionnaire: (I = Independent and D = Dependent)  ADLs: Independent Bathing/Dressing:Independent Meal Prep: Independent Eating: Independent Maintaining continence: Independent Transferring/Ambulation: Independent Managing Meds: Independent Follow up appointments reviewed:  PCP Hospital f/u appt confirmed? Yes  Scheduled to see [facility] on 03/06/20 @1450  Specialist Hospital f/u appt confirmed? yes Scheduled to see on 04/03/20@ 1330   Are transportation arrangements needed? No   If their condition worsens, is the pt aware to call PCP or go to the EmergencyDept.? Yes  CASE:[id number]}  Was the patient provided with contact information for the PCP's office or ED? yes  Was to pt encouraged to call back with questions or concerns? Yes  04/05/20, RN, BSN, CCRN Patient Engagement Center [phone]

## 2020-03-13 ENCOUNTER — Ambulatory Visit (INDEPENDENT_AMBULATORY_CARE_PROVIDER_SITE_OTHER): Payer: Medicaid Other | Admitting: *Deleted

## 2020-03-13 ENCOUNTER — Encounter: Payer: Self-pay | Admitting: *Deleted

## 2020-03-13 ENCOUNTER — Other Ambulatory Visit (INDEPENDENT_AMBULATORY_CARE_PROVIDER_SITE_OTHER): Payer: Medicaid Other | Admitting: Obstetrics and Gynecology

## 2020-03-13 VITALS — BP 130/94 | HR 87 | Ht 66.0 in | Wt 212.0 lb

## 2020-03-13 DIAGNOSIS — Z0130 Encounter for examination of blood pressure without abnormal findings: Secondary | ICD-10-CM

## 2020-03-13 DIAGNOSIS — Z0131 Encounter for examination of blood pressure with abnormal findings: Secondary | ICD-10-CM

## 2020-03-13 NOTE — Progress Notes (Signed)
Norvasc. 5 to be started .vs .vww Vs .vw

## 2020-03-13 NOTE — Progress Notes (Signed)
   NURSE VISIT- BLOOD PRESSURE CHECK  SUBJECTIVE:  Peggy [name] is a 34 y.o. [phone] female here for BP check. She is postpartum, delivery date 02/27/2020.    HYPERTENSION ROS:  Pregnant/postpartum:  . Severe headaches that don't go away with tylenol/other medicines: No  . Visual changes (seeing spots/double/blurred vision) No  . Severe pain under right breast breast or in center of upper chest No  . Severe nausea/vomiting No  . Taking medicines as instructed not applicable   OBJECTIVE:  BP (!) 136/97 (BP Location: Left Arm, Patient Position: Sitting, Cuff Size: Large)   Pulse 92   Ht 5\' 6"  (1.676 m)   Wt (!) 212 lb (96.2 kg)   Breastfeeding Yes   BMI 34.22 kg/m   Appearance alert, well appearing, and in no distress.  ASSESSMENT: Postpartum  blood pressure check  PLAN: Discussed with Dr.   Recommendations: new prescription will be sent   Follow-up: one week.    Emelda Fear  03/13/2020 3:42 PM

## 2020-03-15 MED ORDER — AMLODIPINE BESYLATE 5 MG PO TABS
5.0000 mg | ORAL_TABLET | Freq: Every day | ORAL | 1 refills | Status: DC
Start: 2020-03-15 — End: 2020-03-22

## 2020-03-20 ENCOUNTER — Telehealth (INDEPENDENT_AMBULATORY_CARE_PROVIDER_SITE_OTHER): Payer: Medicaid Other | Admitting: *Deleted

## 2020-03-20 ENCOUNTER — Telehealth: Payer: Self-pay | Admitting: Advanced Practice Midwife

## 2020-03-20 ENCOUNTER — Encounter: Payer: Self-pay | Admitting: *Deleted

## 2020-03-20 VITALS — BP 155/116 | HR 82

## 2020-03-20 DIAGNOSIS — Z0130 Encounter for examination of blood pressure without abnormal findings: Secondary | ICD-10-CM

## 2020-03-20 DIAGNOSIS — Z0131 Encounter for examination of blood pressure with abnormal findings: Secondary | ICD-10-CM

## 2020-03-20 NOTE — Progress Notes (Addendum)
   NURSE VISIT- BLOOD PRESSURE CHECK  SUBJECTIVE:  Peggy [name] is a 34 y.o. [phone] female here for BP check. She is postpartum, delivery date 02/27/2020    HYPERTENSION ROS:  Pregnant/postpartum:  . Severe headaches that don't go away with tylenol/other medicines: No  . Visual changes (seeing spots/double/blurred vision) No  . Severe pain under right breast breast or in center of upper chest No  . Severe nausea/vomiting Yes  . Taking medicines as instructed yes   OBJECTIVE:  BP (!) 155/116 (BP Location: Left Arm, Patient Position: Sitting, Cuff Size: Normal)   Pulse 82   Breastfeeding Yes   Appearance alert, well appearing, and in no distress .  ASSESSMENT: Postpartum  blood pressure check  PLAN: Discussed with [name], CNM, [facility]   Recommendations: Increase Norvasc to 10mg .   Follow-up: 03/22/2020  05/22/2020  03/20/2020 4:32 PM   Chart reviewed for nurse visit. Agree with plan of care. Asymptomatic, increase norvasc to 10mg , f/u in 2 d for bp recheck.  05/20/2020, 03/20/2020 5:12 PM

## 2020-03-20 NOTE — Telephone Encounter (Signed)
Patient had an appt today for a blood pressure check, but she wont be able to make it in. She wants a nurse to call her because her BP was high 144/107 with an 81 pulse.

## 2020-03-21 NOTE — Telephone Encounter (Signed)
Pt states someone called her and told her to increase her BP med to BID. Has an appt tomorrow for a BP check. Advised to keep that appt. Pt voiced understanding. JSY

## 2020-03-22 ENCOUNTER — Encounter: Payer: Self-pay | Admitting: *Deleted

## 2020-03-22 ENCOUNTER — Ambulatory Visit (INDEPENDENT_AMBULATORY_CARE_PROVIDER_SITE_OTHER): Payer: Medicaid Other | Admitting: *Deleted

## 2020-03-22 VITALS — BP 127/91 | HR 83 | Ht 66.0 in | Wt 210.0 lb

## 2020-03-22 DIAGNOSIS — Z0130 Encounter for examination of blood pressure without abnormal findings: Secondary | ICD-10-CM

## 2020-03-22 DIAGNOSIS — I10 Essential (primary) hypertension: Secondary | ICD-10-CM

## 2020-03-22 MED ORDER — AMLODIPINE BESYLATE 10 MG PO TABS: 10.0000 mg | ORAL_TABLET | Freq: Every day | ORAL | 0 refills | Status: DC

## 2020-03-22 NOTE — Addendum Note (Signed)
Addended by: Cheral Marker on: 03/22/2020 01:34 PM   Modules accepted: Orders

## 2020-03-22 NOTE — Progress Notes (Addendum)
   NURSE VISIT- BLOOD PRESSURE CHECK  SUBJECTIVE:  Peggy [name] is a 34 y.o. [phone] female here for BP check. She is postpartum, delivery date 02/27/2020    HYPERTENSION ROS:  Pregnant/postpartum:  . Severe headaches that don't go away with tylenol/other medicines: No  . Visual changes (seeing spots/double/blurred vision) No  . Severe pain under right breast breast or in center of upper chest No  . Severe nausea/vomiting No  . Taking medicines as instructed yes   OBJECTIVE:  BP (!) 135/91 (BP Location: Left Arm, Patient Position: Sitting, Cuff Size: Large)   Pulse 80   Ht 5\' 6"  (1.676 m)   Wt 210 lb (95.3 kg)   Breastfeeding Yes   BMI 33.89 kg/m   Appearance alert, well appearing, and in no distress.  ASSESSMENT: Postpartum  blood pressure check  PLAN: Discussed with , CNM, [facility]   Recommendations: make sure to take Norvasc 10mg  one our prior to appointment.   Follow-up: 8/92021    03/22/2020 12:16 PM   Chart reviewed for nurse visit. Agree with plan of care. Norvasc increased from 5 to 10mg  on 8/4. Has not taken dose this am. To return Monday for bp check w/ nurse, take meds at least 1hr prior to appt. New rx for norvasc 10mg  sent.  , 10/4 03/22/2020 1:32 PM

## 2020-04-03 ENCOUNTER — Ambulatory Visit: Payer: Medicaid Other | Admitting: Advanced Practice Midwife

## 2020-04-29 ENCOUNTER — Other Ambulatory Visit: Payer: Self-pay

## 2020-04-29 ENCOUNTER — Ambulatory Visit (INDEPENDENT_AMBULATORY_CARE_PROVIDER_SITE_OTHER): Payer: Medicaid Other | Admitting: Women's Health

## 2020-04-29 ENCOUNTER — Encounter: Payer: Self-pay | Admitting: Women's Health

## 2020-04-29 DIAGNOSIS — Z3202 Encounter for pregnancy test, result negative: Secondary | ICD-10-CM

## 2020-04-29 DIAGNOSIS — Z8759 Personal history of other complications of pregnancy, childbirth and the puerperium: Secondary | ICD-10-CM

## 2020-04-29 DIAGNOSIS — Z30430 Encounter for insertion of intrauterine contraceptive device: Secondary | ICD-10-CM | POA: Diagnosis not present

## 2020-04-29 DIAGNOSIS — O99325 Drug use complicating the puerperium: Secondary | ICD-10-CM

## 2020-04-29 DIAGNOSIS — O165 Unspecified maternal hypertension, complicating the puerperium: Secondary | ICD-10-CM

## 2020-04-29 DIAGNOSIS — F1490 Cocaine use, unspecified, uncomplicated: Secondary | ICD-10-CM

## 2020-04-29 LAB — POCT URINE PREGNANCY: Preg Test, Ur: NEGATIVE

## 2020-04-29 MED ORDER — LEVONORGESTREL 19.5 MCG/DAY IU IUD: INTRAUTERINE_SYSTEM | Freq: Once | INTRAUTERINE | Status: AC

## 2020-04-29 NOTE — Patient Instructions (Addendum)
Nothing in vagina for 3 days (no sex, douching, tampons, etc...)  Check your strings once a month to make sure you can feel them, if you are not able to please let us know  If you develop a fever of 100.4 or more in the next few weeks, or if you develop severe abdominal pain, please let [place] know  Use a backup method of birth control, such as condoms, for 2 weeks  Continue taking your norvasc (amlodopine) blood pressure medicine every day. Stop taking it 2 days before you come in for your IUD check in 4wks  Tips To Increase Milk Supply  Lots of water! Enough so that your urine is clear  Plenty of calories, if you're not getting enough calories, your milk supply can decrease  Breastfeed/pump often, every 2-3 hours x 20-63mins  Fenugreek 3 pills 3 times a day, this may make your urine smell like maple syrup  Mother's Milk Tea  Lactation cookies, google for the recipe  Real oatmeal  Body Armor sports drinks    Intrauterine Device Insertion, Care After  This sheet gives you information about how to care for yourself after your procedure. Your health care provider may also give you more specific instructions. If you have problems or questions, contact your health care provider. What can I expect after the procedure? After the procedure, it is common to have:  Cramps and pain in the abdomen.  Light bleeding (spotting) or heavier bleeding that is like your menstrual period. This may last for up to a few days.  Lower back pain.  Dizziness.  Headaches.  Nausea. Follow these instructions at home:  Before resuming sexual activity, check to make sure that you can feel the IUD string(s). You should be able to feel the end of the string(s) below the opening of your cervix. If your IUD string is in place, you may resume sexual activity. ? If you had a hormonal IUD inserted more than 7 days after your most recent period started, you will need to use a backup method of birth control  for 7 days after IUD insertion. Ask your health care provider whether this applies to you.  Continue to check that the IUD is still in place by feeling for the string(s) after every menstrual period, or once a month.  Take over-the-counter and prescription medicines only as told by your health care provider.  Do not drive or use heavy machinery while taking prescription pain medicine.  Keep all follow-up visits as told by your health care provider. This is important. Contact a health care provider if:  You have bleeding that is heavier or lasts longer than a normal menstrual cycle.  You have a fever.  You have cramps or abdominal pain that get worse or do not get better with medicine.  You develop abdominal pain that is new or is not in the same area of earlier cramping and pain.  You feel lightheaded or weak.  You have abnormal or bad-smelling discharge from your vagina.  You have pain during sexual activity.  You have any of the following problems with your IUD string(s): ? The string bothers or hurts you or your sexual partner. ? You cannot feel the string. ? The string has gotten longer.  You can feel the IUD in your vagina.  You think you may be pregnant, or you miss your menstrual period.  You think you may have an STI (sexually transmitted infection). Get help right away if:  You  have flu-like symptoms.  You have a fever and chills.  You can feel that your IUD has slipped out of place. Summary  After the procedure, it is common to have cramps and pain in the abdomen. It is also common to have light bleeding (spotting) or heavier bleeding that is like your menstrual period.  Continue to check that the IUD is still in place by feeling for the string(s) after every menstrual period, or once a month.  Keep all follow-up visits as told by your health care provider. This is important.  Contact your health care provider if you have problems with your IUD string(s),  such as the string getting longer or bothering you or your sexual partner. This information is not intended to replace advice given to you by your health care provider. Make sure you discuss any questions you have with your health care provider. Document Revised: 07/16/2017 Document Reviewed: 06/24/2016 Elsevier Patient Education  2020 [employer].

## 2020-04-29 NOTE — Progress Notes (Signed)
POSTPARTUM VISIT Patient name: Peggy [name] MRN [id number]  Date of birth: July 11, 1986 Chief Complaint:   Postpartum Care (IUD placement today)  History of Present Illness:   Peggy [name] is a 34 y.o. [phone] [place] American female being seen today for a postpartum visit. She is 8 weeks postpartum following a spontaneous vaginal delivery at 38.1 gestational weeks, dx w/ GHTN on admission. Anesthesia: none.  I have fully reviewed the prenatal and intrapartum course. 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 Hasn't taken today's dose yet. Bleeding thin lochia. Bowel function is normal. Bladder function is normal.  Patient is not sexually active. Last sexual activity: prior to birth of baby.    No LMP recorded.  Baby's course has been uncomplicated. Baby is feeding by breast & bottle     Upstream - 04/29/20 1554      Pregnancy Intention Screening   Does the patient want to become pregnant in the next year? No    Does the patient's partner want to become pregnant in the next year? No    Would the patient like to discuss contraceptive options today? Yes      Contraception Wrap Up   Current Method Abstinence    End Method IUD or IUS          The pregnancy intention screening data noted above was reviewed. Potential methods of contraception were discussed. The patient elected to proceed with IUD or IUS.   Edinburgh Postpartum Depression Screening:   Edinburgh Postnatal Depression Scale - 04/29/20 1554      Edinburgh Postnatal Depression Scale:  In the Past 7 Days   I have been able to laugh and see the funny side of things. 0    I have looked forward with enjoyment to things. 0    I have blamed myself unnecessarily when things went wrong. 0    I have been anxious or worried for no good reason. 0    I have felt scared or panicky for no good reason. 0    Things have  been getting on top of me. 0    I have been so unhappy that I have had difficulty sleeping. 0    I have felt sad or miserable. 0    I have been so unhappy that I have been crying. 0    The thought of harming myself has occurred to me. 0    Edinburgh Postnatal Depression Scale Total 0          Review of Systems:   Pertinent items are noted in HPI Denies Abnormal vaginal discharge w/ itching/odor/irritation, headaches, visual changes, shortness of breath, chest pain, abdominal pain, severe nausea/vomiting, or problems with urination or bowel movements. Pertinent History Reviewed:  Reviewed past medical,surgical, obstetrical and family history.  Reviewed problem list, medications and allergies. OB History  Gravida Para Term Preterm AB Living  '4 4 4     4  ' SAB TAB Ectopic Multiple Live Births        0 4    # Outcome Date GA Lbr Len/2nd Weight Sex Delivery Anes PTL Lv  4 Term 02/27/20 3835w1d:04 / 00:07 8 lb 6.4 oz (3.81 kg) M Vag-Spont None  LIV  3 Term 03/17/18 [id number] 15 oz (4.054 kg) M Vag-Spont Other N LIV     Complications: Gestational diabetes  2 Term 10/04/07 [id number]  6 lb 8 oz (2.948 kg) F Vag-Spont   LIV  1 Term 03/14/06 [redacted]w[redacted]d 7 lb 8 oz (3.402 kg) M Vag-Spont   LIV   Physical Assessment:   Vitals:   04/29/20 1550 04/29/20 1600  BP: (!) 142/95 (!) 142/98  Pulse: 84 82  Weight: 207 lb 9.6 oz (94.2 kg)   Height: 5' 5.5" (1.664 m)   Body mass index is 34.02 kg/m.       Physical Examination:   General appearance: alert, well appearing, and in no distress  Mental status: alert, oriented to person, place, and time  Skin: warm & dry   Cardiovascular: normal heart rate noted   Respiratory: normal respiratory effort, no distress   Breasts: deferred, no complaints   Abdomen: soft, non-tender   Pelvic: VULVA: normal appearing vulva with no masses, tenderness or lesions, VAGINA: normal appearing vagina with normal color and discharge, no lesions, CERVIX: normal appearing  cervix without discharge or lesions  Rectal: no hemorrhoids  Extremities: no edema       Results for orders placed or performed in visit on 04/29/20 (from the past 24 hour(s))  POCT urine pregnancy   Collection Time: 04/29/20  3:59 PM  Result Value Ref Range   Preg Test, Ur Negative Negative    IUD INSERTION The risks and benefits of the method and placement have been thouroughly reviewed with the patient and all questions were answered.  Specifically the patient is aware of failure rate of 08/998, expulsion of the IUD and of possible perforation.  The patient is aware of irregular bleeding due to the method and understands the incidence of irregular bleeding diminishes with time.  Signed copy of informed consent in chart.   Time out was performed.  A graves speculum was placed in the vagina.  The cervix was visualized, prepped using Betadine, and grasped with a single tooth tenaculum. The uterus was found to be neutral and it sounded to 8 cm.  Liletta  IUD placed per manufacturer's recommendations. The strings were trimmed to approximately 3 cm. The patient tolerated the procedure well.   Informal transvaginal sonogram was performed and the proper placement of the IUD was verified.  Chaperone: JDerrek Monaco NP    Assessment & Plan:  1) Postpartum exam 2) 8 wks s/p SVB 3) Breast and bottlefeeding 4) Depression screening 5) Liletta IUD insertion The patient was given post procedure instructions, including signs and symptoms of infection and to check for the strings after each menses or each month, and refraining from intercourse or anything in the vagina for 3 days. She was given a care card with date IUD placed, and date IUD to be removed. She is scheduled for a f/u appointment in 4 weeks. 6) PPHTN> hasn't taken norvasc 164mtoday, continue taking daily, stop 2d before returns in 4wks for IUD check  Essential components of care per ACOG recommendations:  1.  Mood and well  being:  . Patient with negative depression screening today.  . Pre-existing mental health disorders? No  . Patient does use tobacco. If using tobacco we discussed reduction/cessation and risk of relapse . Substance use disorder? Yes, occ cocaine, denies need for help. Strongly advised complete cessation, discussed HTN effects w/ cocaine  2. Infant care and feeding:  . Patient currently breastfeeding? Yes . Childcare strategy if returning to work/school: family . Infant has a pediatrician/family doctor? Yes  . Recommended that all caregivers be immunized for flu, pertussis and other preventable communicable diseases .  Pt does have material needs met such as stable housing, utilities, food and diapers. If not, referred to local resources for help obtaining these.  3. Sexuality, contraception and birth spacing . Provided guidance regarding sexuality, management of dyspareunia, and resumption of intercourse . Patient does not want a pregnancy in the future.  Desired family size is 4 children.  . Discussed avoiding interpregnancy interval <62mhs and recommended birth spacing of 18 months  4. Sleep and fatigue . Discussed coping options for fatigue and sleep disruption . Encouraged family/partner/community support of 4 hrs of uninterrupted sleep to help with mood and fatigue  5. Physical recovery  . Pt did not have a cesarean section. If yes, assessed incisional pain and providing guidance on normal vs prolonged recovery . Patient had a none laceration, perineal healing and pain reviewed.  . Patient has urinary incontinence? No, fecal incontinence? No  . Patient is safe to resume physical activity. Discussed attainment of healthy weight.  6.  Chronic disease management . Discussed pregnancy complications if any, and their implications for future childbearing and long-term maternal health. . Review recommendations for prevention of recurrent pregnancy complications, such as 17  hydroxyprogesterone caproate to reduce risk for recurrent PTB not applicable, or aspirin to reduce risk of preeclampsia yes. . Pt had GDM: No. If yes, 2hr GTT scheduled: not applicable. . Reviewed medications and non-pregnant dosing including consideration of whether pt is breastfeeding using a reliable resource such as LactMed: not applicable . Referred for f/u w/ PCP or subspecialist providers as indicated: not applicable  7. Health maintenance . Last pap smear 09/02/17 and results were normal . Mammogram at 35yo or earlier if indicated  Meds:  Meds ordered this encounter  Medications  . levonorgestrel (LILETTA) 19.5 MCG/DAY IUD    Follow-up: Return in about 4 weeks (around 05/27/2020) for IUD f/u in person w/ CNM.   Orders Placed This Encounter  Procedures  . POCT urine pregnancy    KMillard WSt[facility]9/13/2021 5:08 PM

## 2020-05-13 NOTE — Addendum Note (Signed)
Addended by: Nance Pear F on: 05/13/2020 08:26 AM   Modules accepted: Level of Service

## 2020-05-22 ENCOUNTER — Other Ambulatory Visit: Payer: Self-pay | Admitting: Obstetrics & Gynecology

## 2020-05-22 NOTE — Telephone Encounter (Signed)
Pt requesting refill sent to [facility] for bp medicine. Amlodipine.

## 2020-05-23 MED ORDER — AMLODIPINE BESYLATE 10 MG PO TABS: 10.0000 mg | ORAL_TABLET | Freq: Every day | ORAL | 3 refills | Status: DC

## 2020-05-23 NOTE — Addendum Note (Signed)
Addended by: Annamarie Dawley on: 05/23/2020 10:20 AM   Modules accepted: Orders

## 2020-06-21 DIAGNOSIS — Z20822 Contact with and (suspected) exposure to covid-19: Secondary | ICD-10-CM | POA: Diagnosis not present

## 2020-06-26 ENCOUNTER — Telehealth: Payer: Self-pay | Admitting: Women's Health

## 2020-06-26 NOTE — Telephone Encounter (Signed)
Pt aware Amlodipine should have refills on it. Just call [facility] and have them fill prescription. Pt voiced understanding. JSY

## 2020-06-26 NOTE — Telephone Encounter (Signed)
Pt requesting refill on her AMLODIPINE, states she's almost out  Please advise & notify pt   [facility]

## 2020-08-21 ENCOUNTER — Encounter: Payer: Self-pay | Admitting: *Deleted

## 2020-08-21 ENCOUNTER — Telehealth: Payer: Self-pay | Admitting: Adult Health

## 2020-08-21 NOTE — Telephone Encounter (Signed)
Spoke with pt. Pt states she is not having urinary symptoms; when her bladder gets full, she has pain. Pt was advised to not let bladder get full. Empty bladder often. Pain/cramping with BM's. Pt states she is not constipated and her BM's are not big in size. She hasn't noticed any blood with BM's. Pt hasn't been able to fill her IUD strings. Pt has an appt on 1/13 but was advised if pain got worse or anything changes, let us know. Pt voiced understanding. JSY

## 2020-08-21 NOTE — Telephone Encounter (Signed)
Pain when bladder is full, relief when urinating  Pain with needing to urinate or have a bowel movement,  thinks IUD is out of place, please advise, states she'd like to talk to the nurse

## 2020-08-21 NOTE — Telephone Encounter (Signed)
Voice mail full. Sent MyChart asking pt if she wants to come in for urine check. JSY

## 2020-08-29 ENCOUNTER — Ambulatory Visit: Payer: Medicaid Other | Admitting: Adult Health

## 2020-08-30 DIAGNOSIS — Z20822 Contact with and (suspected) exposure to covid-19: Secondary | ICD-10-CM | POA: Diagnosis not present

## 2021-04-09 ENCOUNTER — Other Ambulatory Visit: Payer: Self-pay

## 2021-04-09 ENCOUNTER — Emergency Department (HOSPITAL_COMMUNITY)
Admission: EM | Admit: 2021-04-09 | Discharge: 2021-04-10 | Disposition: A | Discharge status: Home / Self Care | Payer: Medicaid Other | Attending: Emergency Medicine | Admitting: Emergency Medicine

## 2021-04-09 DIAGNOSIS — K8020 Calculus of gallbladder without cholecystitis without obstruction: Secondary | ICD-10-CM | POA: Diagnosis not present

## 2021-04-09 DIAGNOSIS — R1011 Right upper quadrant pain: Secondary | ICD-10-CM

## 2021-04-09 DIAGNOSIS — F17210 Nicotine dependence, cigarettes, uncomplicated: Secondary | ICD-10-CM | POA: Diagnosis not present

## 2021-04-09 DIAGNOSIS — R1010 Upper abdominal pain, unspecified: Secondary | ICD-10-CM | POA: Diagnosis present

## 2021-04-09 LAB — CBC
HCT: 42.4 % (ref 36.0–46.0)
Hemoglobin: 14.5 g/dL (ref 12.0–15.0)
MCH: 28.1 pg (ref 26.0–34.0)
MCHC: 34.2 g/dL (ref 30.0–36.0)
MCV: 82.2 fL (ref 80.0–100.0)
Platelets: 260 10*3/uL (ref 150–400)
RBC: 5.16 MIL/uL — ABNORMAL HIGH (ref 3.87–5.11)
RDW: 12.9 % (ref 11.5–15.5)
WBC: 10.6 10*3/uL — ABNORMAL HIGH (ref 4.0–10.5)
nRBC: 0 % (ref 0.0–0.2)

## 2021-04-09 LAB — COMPREHENSIVE METABOLIC PANEL
ALT: 24 U/L (ref 0–44)
AST: 20 U/L (ref 15–41)
Albumin: 4 g/dL (ref 3.5–5.0)
Alkaline Phosphatase: 74 U/L (ref 38–126)
Anion gap: 8 (ref 5–15)
BUN: 10 mg/dL (ref 6–20)
CO2: 28 mmol/L (ref 22–32)
Calcium: 8.9 mg/dL (ref 8.9–10.3)
Chloride: 102 mmol/L (ref 98–111)
Creatinine, Ser: 0.91 mg/dL (ref 0.44–1.00)
GFR, Estimated: >60 mL/min (ref >60)
Glucose, Bld: 111 mg/dL — ABNORMAL HIGH (ref 70–99)
Potassium: 3.9 mmol/L (ref 3.5–5.1)
Sodium: 138 mmol/L (ref 135–145)
Total Bilirubin: 0.7 mg/dL (ref 0.3–1.2)
Total Protein: 6.9 g/dL (ref 6.5–8.1)

## 2021-04-09 LAB — URINALYSIS, ROUTINE W REFLEX MICROSCOPIC
Bacteria, UA: NONE SEEN
Bilirubin Urine: NEGATIVE
Glucose, UA: NEGATIVE mg/dL
Ketones, ur: NEGATIVE mg/dL
Nitrite: NEGATIVE
Protein, ur: NEGATIVE mg/dL
Specific Gravity, Urine: 1.024 (ref 1.005–1.030)
pH: 5 (ref 5.0–8.0)

## 2021-04-09 LAB — LIPASE, BLOOD: Lipase: 40 U/L (ref 11–51)

## 2021-04-09 LAB — I-STAT BETA HCG BLOOD, ED (MC, WL, AP ONLY): I-stat hCG, quantitative: <5 m[IU]/mL (ref <5)

## 2021-04-09 MED ORDER — ONDANSETRON 4 MG PO TBDP
4.0000 mg | ORAL_TABLET | Freq: Once | ORAL | Status: AC | PRN
  Administered 2021-04-09: 4 mg via ORAL
  Filled 2021-04-09: qty 1

## 2021-04-09 NOTE — ED Triage Notes (Signed)
Pt here for abd pain, N/V for aweek, says tonight it got worse. Pt reports upper abd pain that radiates down, has had 1 episode of emesis today, had some diarrhea a few days ago. States pain is worse when eating.

## 2021-04-10 ENCOUNTER — Emergency Department (HOSPITAL_COMMUNITY): Payer: Medicaid Other

## 2021-04-10 DIAGNOSIS — R1011 Right upper quadrant pain: Secondary | ICD-10-CM | POA: Diagnosis not present

## 2021-04-10 DIAGNOSIS — K8020 Calculus of gallbladder without cholecystitis without obstruction: Secondary | ICD-10-CM | POA: Diagnosis not present

## 2021-04-10 MED ORDER — OXYCODONE-ACETAMINOPHEN 5-325 MG PO TABS: 1.0000 | ORAL_TABLET | Freq: Four times a day (QID) | ORAL | 0 refills | Status: DC | PRN

## 2021-04-10 MED ORDER — DICYCLOMINE HCL 20 MG PO TABS: 20.0000 mg | ORAL_TABLET | Freq: Two times a day (BID) | ORAL | 0 refills | Status: DC | PRN

## 2021-04-10 MED ORDER — HYDROMORPHONE HCL 1 MG/ML IJ SOLN
1.0000 mg | Freq: Once | INTRAMUSCULAR | Status: AC
  Administered 2021-04-10: 1 mg via INTRAVENOUS
  Filled 2021-04-10: qty 1

## 2021-04-10 MED ORDER — METOCLOPRAMIDE HCL 10 MG PO TABS: 10.0000 mg | ORAL_TABLET | Freq: Four times a day (QID) | ORAL | 0 refills | Status: DC

## 2021-04-10 MED ORDER — DICYCLOMINE HCL 10 MG/ML IM SOLN
20.0000 mg | Freq: Once | INTRAMUSCULAR | Status: AC
  Administered 2021-04-10: 20 mg via INTRAMUSCULAR
  Filled 2021-04-10: qty 2

## 2021-04-10 MED ORDER — HYDROMORPHONE HCL 1 MG/ML IJ SOLN
1.0000 mg | Freq: Once | INTRAMUSCULAR | Status: AC
Start: 2021-04-10 — End: 2021-04-10
  Administered 2021-04-10: 1 mg via INTRAVENOUS
  Filled 2021-04-10: qty 1

## 2021-04-10 MED ORDER — METOCLOPRAMIDE HCL 5 MG/ML IJ SOLN
10.0000 mg | Freq: Once | INTRAMUSCULAR | Status: AC
  Administered 2021-04-10: 10 mg via INTRAVENOUS
  Filled 2021-04-10: qty 2

## 2021-04-10 MED ORDER — ONDANSETRON HCL 4 MG/2ML IJ SOLN
4.0000 mg | Freq: Once | INTRAMUSCULAR | Status: AC
  Administered 2021-04-10: 4 mg via INTRAVENOUS
  Filled 2021-04-10: qty 2

## 2021-04-10 MED ORDER — SODIUM CHLORIDE 0.9 % IV BOLUS
1000.0000 mL | Freq: Once | INTRAVENOUS | Status: AC
  Administered 2021-04-10: 1000 mL via INTRAVENOUS

## 2021-04-10 NOTE — ED Provider Notes (Signed)
[facility] EMERGENCY DEPARTMENT Provider Note   CSN: [id number] Arrival date & time: 04/09/21  2221     History Chief Complaint  Patient presents with   Abdominal Pain   Nausea    Peggy [name] is a 35 y.o. female who presents with abdominal pain, nausea, and vomiting for several weeks. Patient reports upper abdominal pain and multiple episodes of vomiting per day. Several episodes of diarrhea a few days ago. States her pain is made worse after eating, especially after consuming meat products. Reports episode of emesis after triage that looked "bright green". She was given a pain pill by her father en route to ED, unsure of what it was. No fevers or chills, hematemesis, chest pain, SOB, dysuria, urinary frequency or urgency, hematuria, or blood in stool.    Abdominal Pain Associated symptoms: diarrhea, nausea and vomiting   Associated symptoms: no chest pain, no chills, no constipation, no dysuria, no fever and no shortness of breath       Past Medical History:  Diagnosis Date   Gestational diabetes    metformin   Medical history non-contributory    Pregnancy induced hypertension     Patient Active Problem List   Diagnosis Date Noted   Encounter for IUD insertion 04/29/2020   Postpartum hypertension 04/29/2020   Cocaine use 09/05/2019   History of gestational hypertension 04/25/2018   History of gestational diabetes 01/14/2018   Vitamin D deficiency 09/08/2017    Past Surgical History:  Procedure Laterality Date   COLPOSCOPY       OB History     Gravida  4   Para  4   Term  4   Preterm      AB      Living  4      SAB      IAB      Ectopic      Multiple  0   Live Births  4           Family History  Problem Relation Age of Onset   Hypertension Mother    Asthma Daughter    Asthma Son    Diabetes Maternal Grandmother    Other Son        RSV    Social History   Tobacco Use   Smoking status: Every Day     Packs/day: 0.25    Types: Cigarettes   Smokeless tobacco: Never  Vaping Use   Vaping Use: Never used  Substance Use Topics   Alcohol use: No   Drug use: No    Home Medications Prior to Admission medications   Medication Sig Start Date End Date Taking? Authorizing Provider  acetaminophen (TYLENOL) 325 MG tablet Take 2 tablets (650 mg total) by mouth every 6 (six) hours as needed (for pain scale < 4). 02/29/20  Yes Venora Maples, MD  amLODipine (NORVASC) 10 MG tablet Take 1 tablet (10 mg total) by mouth daily. 05/23/20  Yes Adline Potter, NP  Blood Pressure Monitor MISC For regular home bp monitoring during pregnancy 09/04/19  Yes Arabella Merles, CNM  dicyclomine (BENTYL) 20 MG tablet Take 1 tablet (20 mg total) by mouth every 12 (twelve) hours as needed (for abdominal pain, cramping). 04/10/21  Yes Antony Madura, PA-C  metoCLOPramide (REGLAN) 10 MG tablet Take 1 tablet (10 mg total) by mouth every 6 (six) hours. 04/10/21  Yes Antony Madura, PA-C  oxyCODONE-acetaminophen (PERCOCET/ROXICET) 5-325 MG tablet Take 1-2 tablets by mouth  every 6 (six) hours as needed for severe pain. 04/10/21  Yes Antony Madura, PA-C  Prenat-FeCbn-FeAsp-Meth-FA-DHA (PRENATE MINI) 18-0.6-0.4-350 MG CAPS Take 1 capsule by mouth daily. Patient not taking: Reported on 04/10/2021 10/23/19   Jacklyn Shell, CNM    Allergies    Aspirin  Review of Systems   Review of Systems  Constitutional:  Negative for chills and fever.  Respiratory:  Negative for shortness of breath.   Cardiovascular:  Negative for chest pain.  Gastrointestinal:  Positive for abdominal distention, abdominal pain, diarrhea, nausea and vomiting. Negative for blood in stool and constipation.  Genitourinary:  Negative for dysuria, frequency and urgency.  All other systems reviewed and are negative.  Physical Exam Updated Vital Signs BP (!) 121/91 (BP Location: Left Arm)   Pulse 79   Temp 98.1 F (36.7 C) (Oral)   Resp 16   Ht 5'  5.5" (1.664 m)   Wt 95 kg   SpO2 99%   BMI 34.32 kg/m   Physical Exam Vitals and nursing note reviewed.  Constitutional:      Appearance: Normal appearance. She is well-developed.  HENT:     Head: Normocephalic and atraumatic.  Eyes:     Conjunctiva/sclera: Conjunctivae normal.  Cardiovascular:     Rate and Rhythm: Normal rate and regular rhythm.  Pulmonary:     Effort: Pulmonary effort is normal. No respiratory distress.     Breath sounds: Normal breath sounds.  Abdominal:     General: Bowel sounds are normal. There is distension.     Palpations: Abdomen is soft.     Tenderness: There is abdominal tenderness in the right upper quadrant, epigastric area and left upper quadrant. There is guarding. There is no rebound.  Skin:    General: Skin is warm and dry.  Neurological:     General: No focal deficit present.     Mental Status: She is alert and oriented to person, place, and time.    ED Results / Procedures / Treatments   Labs (all labs ordered are listed, but only abnormal results are displayed) Labs Reviewed  COMPREHENSIVE METABOLIC PANEL - Abnormal; Notable for the following components:      Result Value   Glucose, Bld 111 (*)    All other components within normal limits  CBC - Abnormal; Notable for the following components:   WBC 10.6 (*)    RBC 5.16 (*)    All other components within normal limits  URINALYSIS, ROUTINE W REFLEX MICROSCOPIC - Abnormal; Notable for the following components:   APPearance HAZY (*)    Hgb urine dipstick MODERATE (*)    Leukocytes,Ua TRACE (*)    All other components within normal limits  LIPASE, BLOOD  I-STAT BETA HCG BLOOD, ED ([facility], [facility], [facility] ONLY)    EKG None  Radiology US Abdomen Limited RUQ (LIVER/GB)  Result Date: 04/10/2021 CLINICAL DATA:  35 year old female with right upper quadrant pain. EXAM: ULTRASOUND ABDOMEN LIMITED RIGHT UPPER QUADRANT COMPARISON:  None. FINDINGS: Gallbladder: Multiple small shadowing gallstones,  individually estimated up to 10 mm in size (image 18). Gallbladder wall thickness is at the upper limits of normal, 2-3 mm. But there is no pericholecystic fluid and no sonographic Murphy sign was elicited. Common bile duct: Diameter: 5 mm, normal. Liver: No focal lesion identified. Within normal limits in parenchymal echogenicity. Portal vein is patent on color Doppler imaging with normal direction of blood flow towards the liver. Other: Negative visible right kidney. IMPRESSION: Positive for Cholelithiasis, but no evidence  of acute cholecystitis or bile duct obstruction. Electronically Signed   By: Odessa Fleming M.D.   On: 04/10/2021 04:34    Procedures Procedures   Medications Ordered in ED Medications  ondansetron (ZOFRAN-ODT) disintegrating tablet 4 mg (4 mg Oral Given 04/09/21 2236)  ondansetron (ZOFRAN) injection 4 mg (4 mg Intravenous Given 04/10/21 0343)  sodium chloride 0.9 % bolus 1,000 mL (0 mLs Intravenous Stopped 04/10/21 0500)  HYDROmorphone (DILAUDID) injection 1 mg (1 mg Intravenous Given 04/10/21 0343)  HYDROmorphone (DILAUDID) injection 1 mg (1 mg Intravenous Given 04/10/21 0610)  dicyclomine (BENTYL) injection 20 mg (20 mg Intramuscular Given 04/10/21 0609)  metoCLOPramide (REGLAN) injection 10 mg (10 mg Intravenous Given 04/10/21 1062)    ED Course  I have reviewed the triage vital signs and the nursing notes.  Pertinent labs & imaging results that were available during my care of the patient were reviewed by me and considered in my medical decision making (see chart for details).    MDM Rules/Calculators/A&P                           Patient is 35 y/o female who presents with several weeks of colicky abdominal pain, nausea and vomiting. States symptoms are worse after eating, especially after consuming meat products. Denies blood in her emesis, reports it appearing bilious. CBC, CMP, UA, Lipase unremarkable. Received fluids, zofran, and dilaudid. [place] RUQ shows cholelithiasis, no  evidence of acute cholecystitis or bile duct obstruction. Pain improved over ED course and vitals have been stable.   Patient would benefit from outpatient follow up with general surgery for management of her gallstones. She can discharge to home with percocet, reglan, and bentyl. Patient agreeable to plan.  Final Clinical Impression(s) / ED Diagnoses Final diagnoses:  RUQ pain  Calculus of gallbladder without cholecystitis without obstruction    Rx / DC Orders ED Discharge Orders          Ordered    oxyCODONE-acetaminophen (PERCOCET/ROXICET) 5-325 MG tablet  Every 6 hours PRN        04/10/21 0700    metoCLOPramide (REGLAN) 10 MG tablet  Every 6 hours        04/10/21 0700    dicyclomine (BENTYL) 20 MG tablet  Every 12 hours PRN        04/10/21 0700             Dreyton Roessner T, PA-C 04/10/21 0713    Glynn Octave, MD 04/10/21 [id number]

## 2021-04-10 NOTE — ED Notes (Signed)
Pt wheeled to waiting room. Pt verbalized understanding of discharge instructions.   

## 2021-04-10 NOTE — Discharge Instructions (Addendum)
Your ultrasound showed gallstones without signs of infection to your gallbladder. You can cycle tylenol and ibuprofen for pain which should work better than ibuprofen on its own. I am writing you a prescription for Reglan for nausea to take as needed.  If your pain remains severe and unrelenting, you may use Bentyl as prescribed in addition to Percocet.  Do not drive or drink alcohol after taking Percocet as it may make you drowsy and impair your judgment.  Stick to only drinking fluids for the next 12-24 hours. Once you feel well enough you can try simple or clear foods for about 1-2 days. Please follow up with general surgery to discuss potential surgery for definitive symptom control.

## 2021-04-11 ENCOUNTER — Telehealth: Payer: Self-pay

## 2021-04-11 NOTE — Telephone Encounter (Signed)
Transition Care Management Unsuccessful Follow-up Telephone Call  Date of discharge and from where:  04/10/2021[facility]   Attempts:  1st Attempt  Reason for unsuccessful TCM follow-up call:  Left voice message    

## 2021-04-13 NOTE — Telephone Encounter (Signed)
Transition Care Management Unsuccessful Follow-up Telephone Call  Date of discharge and from where:  04/10/2021 from [facility]  Attempts:  2nd Attempt  Reason for unsuccessful TCM follow-up call:  Unable to leave message

## 2021-04-14 NOTE — Telephone Encounter (Signed)
Transition Care Management Unsuccessful Follow-up Telephone Call  Date of discharge and from where:  04/10/2021 [name]  Attempts:  3rd Attempt  Reason for unsuccessful TCM follow-up call:  Unable to leave message

## 2022-03-28 ENCOUNTER — Other Ambulatory Visit: Payer: Self-pay

## 2022-03-28 ENCOUNTER — Encounter (HOSPITAL_COMMUNITY): Payer: Self-pay

## 2022-03-28 ENCOUNTER — Emergency Department (HOSPITAL_COMMUNITY)
Admission: EM | Admit: 2022-03-28 | Discharge: 2022-03-28 | Disposition: A | Discharge status: Home / Self Care | Payer: Medicaid Other | Attending: Emergency Medicine | Admitting: Emergency Medicine

## 2022-03-28 ENCOUNTER — Emergency Department (HOSPITAL_COMMUNITY): Payer: Medicaid Other

## 2022-03-28 DIAGNOSIS — R531 Weakness: Secondary | ICD-10-CM | POA: Diagnosis not present

## 2022-03-28 DIAGNOSIS — T50901A Poisoning by unspecified drugs, medicaments and biological substances, accidental (unintentional), initial encounter: Secondary | ICD-10-CM

## 2022-03-28 DIAGNOSIS — T405X1A Poisoning by cocaine, accidental (unintentional), initial encounter: Secondary | ICD-10-CM | POA: Insufficient documentation

## 2022-03-28 DIAGNOSIS — S0993XA Unspecified injury of face, initial encounter: Secondary | ICD-10-CM | POA: Diagnosis not present

## 2022-03-28 DIAGNOSIS — T887XXA Unspecified adverse effect of drug or medicament, initial encounter: Secondary | ICD-10-CM | POA: Diagnosis not present

## 2022-03-28 DIAGNOSIS — R000 Tachycardia, unspecified: Secondary | ICD-10-CM | POA: Diagnosis not present

## 2022-03-28 DIAGNOSIS — R4020 Unspecified coma: Secondary | ICD-10-CM | POA: Diagnosis not present

## 2022-03-28 LAB — I-STAT BETA HCG BLOOD, ED (MC, WL, AP ONLY): I-stat hCG, quantitative: <5 m[IU]/mL (ref <5)

## 2022-03-28 MED ORDER — ONDANSETRON 4 MG PO TBDP
4.0000 mg | ORAL_TABLET | Freq: Once | ORAL | Status: AC
  Administered 2022-03-28: 4 mg via ORAL
  Filled 2022-03-28: qty 1

## 2022-03-28 MED ORDER — LACTATED RINGERS IV BOLUS
1000.0000 mL | Freq: Once | INTRAVENOUS | Status: AC
  Administered 2022-03-28: 1000 mL via INTRAVENOUS

## 2022-03-28 NOTE — Discharge Instructions (Addendum)
You were seen today for accidental drug overdose and facial bruising.  Your blood work and imaging studies do not show any evidence of new fracture of your face.  Please follow-up with your primary care doctor in the next 2 to 3 days regarding today's visit and your symptoms. Please return to the emergency department if you experience any chest pain, difficulty breathing, abdominal pain, altered mental status, severe fever, or any other concerning symptom.  Thank you for allowing [place] to participate in your care.

## 2022-03-28 NOTE — ED Provider Notes (Signed)
  Vitals  BP 112/61   Pulse 99   Temp 97.9 F (36.6 C) (Oral)   Resp 12   Ht 5\' 6"  (1.676 m)   Wt 95.3 kg   SpO2 100%   BMI 33.89 kg/m    Procedures  Procedures  ED Course / MDM   Clinical Course as of 03/28/22 1523  Sat Mar 28, 2022  1517 S. 36yoF p/w unintentional overdose, suspected fentanyl.  - f/u CT head/maxillofacial reads - reassess [BR]    Clinical Course User Index [BR] Mar 30, 2022, MD   Medical Decision Making Amount and/or Complexity of Data Reviewed Radiology: ordered.  Risk Prescription drug management.   Per initial provider note: "36 y.o. female with no pertinent past medical history presents the emergency department after drug overdose."  Plan at the time of my assumption of care includes: Follow-up CT imaging, reassess  I went and assessed the patient, and found them to be resting comfortably in bed.  Patient did experience episode of emesis and was provided Zofran, and reported significant relief.  Additional interventions during my shift include ODT Zofran  CT head and maxillofacial regions without evidence of acute intracranial abnormality or traumatic injury per radiology read.  Patient mental status appears back to baseline. The patient is safe and stable for discharge at this time with return precautions provided and a plan for follow-up care with PCP in the next 2-3 days.  The plan for this patient was discussed with Dr. 30, who voiced agreement and who oversaw evaluation and treatment of this patient.         Denton Lank, MD 03/28/22 05/28/22    Kem Boroughs, MD 03/29/22 0001

## 2022-03-28 NOTE — ED Provider Notes (Signed)
[facility] EMERGENCY DEPARTMENT Provider Note   CSN: [id number] Arrival date & time: 03/28/22  1333     History  Chief Complaint  Patient presents with   Drug Overdose    Peggy [name] is a 36 y.o. female with no pertinent past medical history presents the emergency department after drug overdose.  The patient was reportedly "out partying last night."  She states that "I snorted some cocaine at about noon and not remember the rest."  EMS was summoned to the patient's hotel that she was staying at she was reportedly found unresponsive.  The surrounding details regarding how long she was unresponsive for her unclear.  Per EMS, the patient had required rescue breathing for several minutes after administration of intramuscular Narcan she had reportedly returned to spontaneous respiratory activity and was responsive although somnolent.  Patient states that the left side of her face is sore and thinks that she had fallen onto it from a seated position although she is unsure.  She denies taking blood thinning medications.  She denies intentional overdose and was under the impression that she was snorting cocaine.  Aside from alcohol consumption she denies any other substance use currently.  Aside from feeling generally fatigued she has no additional symptoms at this time.  Drug Overdose Pertinent negatives include no chest pain, no abdominal pain and no shortness of breath.   Past Medical History:  Diagnosis Date   Gestational diabetes    metformin   Medical history non-contributory    Pregnancy induced hypertension        Home Medications Prior to Admission medications   Medication Sig Start Date End Date Taking? Authorizing Provider  acetaminophen (TYLENOL) 325 MG tablet Take 2 tablets (650 mg total) by mouth every 6 (six) hours as needed (for pain scale < 4). 02/29/20   Venora Maples, MD  amLODipine (NORVASC) 10 MG tablet Take 1 tablet (10 mg total) by mouth  daily. 05/23/20   Adline Potter, NP  Blood Pressure Monitor MISC For regular home bp monitoring during pregnancy 09/04/19   Arabella Merles, CNM  dicyclomine (BENTYL) 20 MG tablet Take 1 tablet (20 mg total) by mouth every 12 (twelve) hours as needed (for abdominal pain, cramping). 04/10/21   Antony Madura, PA-C  metoCLOPramide (REGLAN) 10 MG tablet Take 1 tablet (10 mg total) by mouth every 6 (six) hours. 04/10/21   Antony Madura, PA-C  oxyCODONE-acetaminophen (PERCOCET/ROXICET) 5-325 MG tablet Take 1-2 tablets by mouth every 6 (six) hours as needed for severe pain. 04/10/21   Antony Madura, PA-C  Prenat-FeCbn-FeAsp-Meth-FA-DHA (PRENATE MINI) 18-0.6-0.4-350 MG CAPS Take 1 capsule by mouth daily. Patient not taking: Reported on 04/10/2021 10/23/19   Jacklyn Shell, CNM      Allergies    Aspirin    Review of Systems   Review of Systems  Constitutional:  Positive for fatigue. Negative for chills and fever.  HENT:  Negative for ear pain and sore throat.   Eyes:  Negative for pain and visual disturbance.  Respiratory:  Negative for cough and shortness of breath.   Cardiovascular:  Negative for chest pain and palpitations.  Gastrointestinal:  Negative for abdominal pain and vomiting.  Genitourinary:  Negative for dysuria and hematuria.  Musculoskeletal:  Negative for arthralgias and back pain.  Skin:  Negative for color change and rash.  Neurological:  Negative for seizures and syncope.  All other systems reviewed and are negative.   Physical Exam Updated Vital Signs BP 125/73 (  BP Location: Left Arm)   Pulse (!) 102   Temp 97.9 F (36.6 C) (Oral)   Resp (!) 21   Ht 5\' 6"  (1.676 m)   Wt 95.3 kg   SpO2 97%   BMI 33.89 kg/m  Physical Exam Vitals and nursing note reviewed.  Constitutional:      General: She is not in acute distress.    Appearance: She is well-developed.     Comments: Upon entering the exam room the patient is lying in bed awake and alert.  She has an  immediately visible left facial ecchymosis encompassing the entirety of her cheek.  HENT:     Head: Normocephalic and atraumatic.     Comments: Left facial ecchymosis with some edema.  Some tenderness to the patient's mandible and maxilla without palpable deformity.  Dried blood in the left nare.  No nasal septal hematoma. Eyes:     Conjunctiva/sclera: Conjunctivae normal.     Comments: Pupils are pinpoint but reactive to light bilaterally.  Extraocular movements are intact.  Neck:     Comments: No cervical spinal tenderness step-off or deformity.  No evidence of trauma to the neck Cardiovascular:     Rate and Rhythm: Normal rate and regular rhythm.     Heart sounds: No murmur heard.    Comments: Regular rate and rhythm no murmurs or gallops. Pulmonary:     Effort: Pulmonary effort is normal. No respiratory distress.     Breath sounds: Normal breath sounds.     Comments: Lungs are clear to auscultation bilaterally.  No evidence of trauma to the chest wall. Abdominal:     Palpations: Abdomen is soft.     Tenderness: There is no abdominal tenderness.     Comments: Soft nondistended nontender.  No visible evidence of trauma to the abdomen.  Musculoskeletal:        General: No swelling.     Cervical back: Neck supple.  Skin:    General: Skin is warm and dry.     Capillary Refill: Capillary refill takes less than 2 seconds.  Neurological:     Mental Status: She is alert.     Comments: Patient is somnolent although is easily arousable and answers questions with detail.  She does not require restimulation during interview or exam.  She is moving all extremities spontaneously.  Grip strength is symmetric.  Psychiatric:        Mood and Affect: Mood normal.     ED Results / Procedures / Treatments   Labs (all labs ordered are listed, but only abnormal results are displayed) Labs Reviewed - No data to display  EKG None  Radiology No results found.  Procedures Procedures     Medications Ordered in ED Medications - No data to display  ED Course/ Medical Decision Making/ A&P Clinical Course as of 03/28/22 1525  Sat Mar 28, 2022  1517 S. 36yoF p/w unintentional overdose, suspected fentanyl.  - f/u CT head/maxillofacial reads - reassess [BR]    Clinical Course User Index [BR] Mar 30, 2022, MD                           Medical Decision Making Amount and/or Complexity of Data Reviewed Independent Historian: EMS Radiology: ordered. ECG/medicine tests: ordered and independent interpretation performed. Decision-making details documented in ED Course.   Patient presents to the emergency department 3 mildly tachycardic to the low 100s otherwise hemodynamically stable with pinpoint pupils, somnolence and response  to Narcan after snorting an unknown substance.  Patient presentation is most consistent with opioid overdose.  The patient does have evidence of ecchymosis to the left face as above with unknown mechanism.  Will obtain CT of the head and CT of the face for traumatic work-up.  Do not feel that the patient requires other traumatic imaging as her exam is otherwise completely unremarkable.  We will observe after Narcan administration for approximately 2 hours and reassess need for further Narcan  I have personally reviewed and interpreted the patient's EKG which was markable for low-grade sinus tachycardia to 102.  No ischemic findings.  Plan to follow-up traumatic work-up and reassess mental status with likely pending discharge thereafter.  Care of this patient was transitioned to the oncoming provider Dr. Arletha Grippe at 1500 discussion of plan as above            Final Clinical Impression(s) / ED Diagnoses Final diagnoses:  Accidental overdose, initial encounter    Rx / DC Orders ED Discharge Orders     None         Duard Brady, MD 03/28/22 1527    Tegeler, Canary Brim, MD 03/30/22 2101

## 2022-03-28 NOTE — ED Triage Notes (Addendum)
Patient bib [facility]. Patient was at the hotel and snorted cocaine per patient. EMS provided rescue breathing for 10 mins. administered 2mg  narcan IM. Bruising to bil cheeks.   VSS 111/88 HR70 100%RA BG100

## 2023-04-08 IMAGING — US US ABDOMEN LIMITED
1 series · 14 of 25 positions shown · non-contrast
Comparison: None.

CLINICAL DATA: 35-year-old female with right upper quadrant pain.

EXAM:
ULTRASOUND ABDOMEN LIMITED RIGHT UPPER QUADRANT

[Series 1: us abdomen limited ruq (liver/gb) · 14 of 51 slices shown]
[im 1/51]
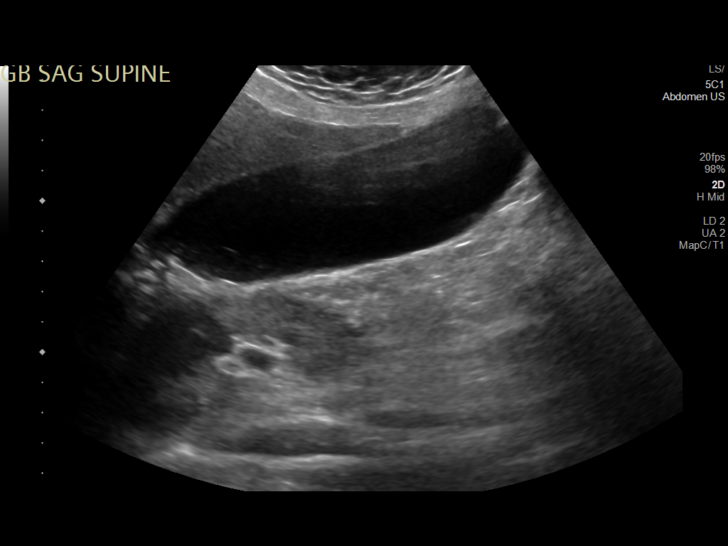
[im 5/51]
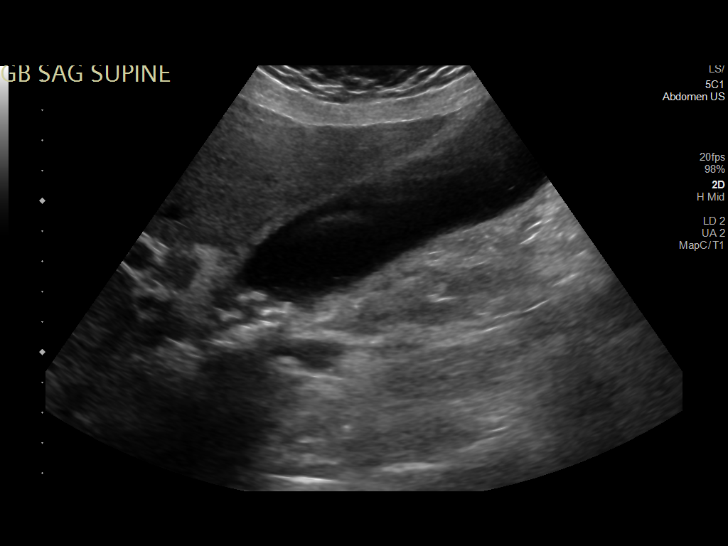
[im 9/51]
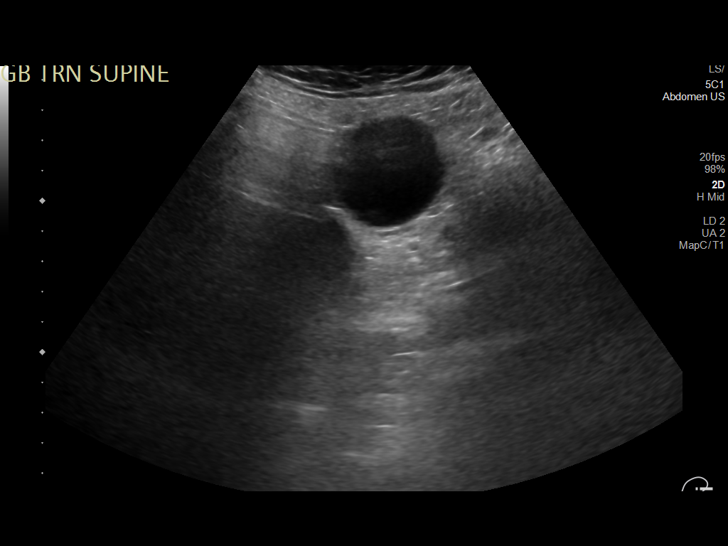
[im 13/51]
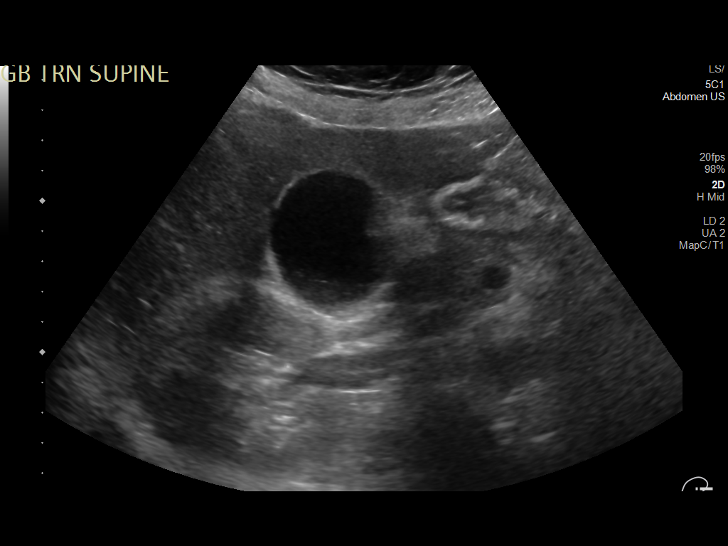
[im 17/51]
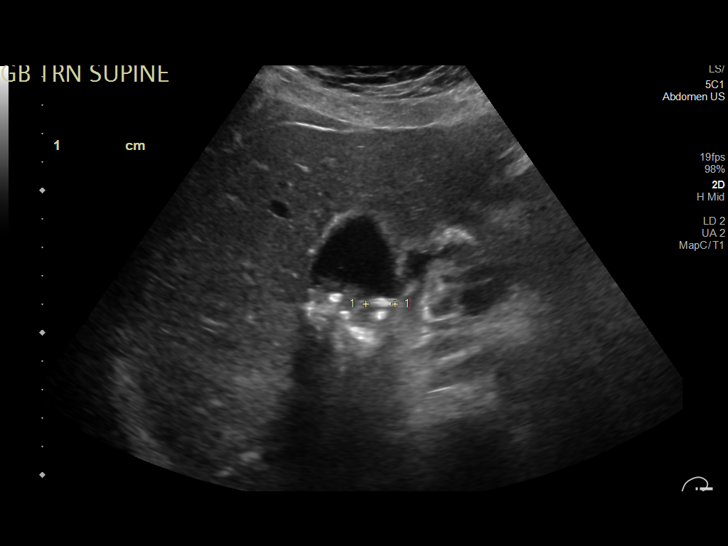
[im 19/51]
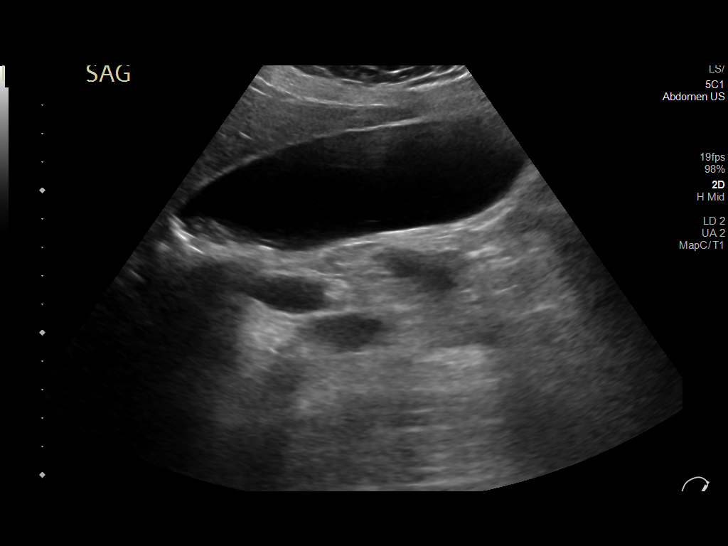
[im 23/51]
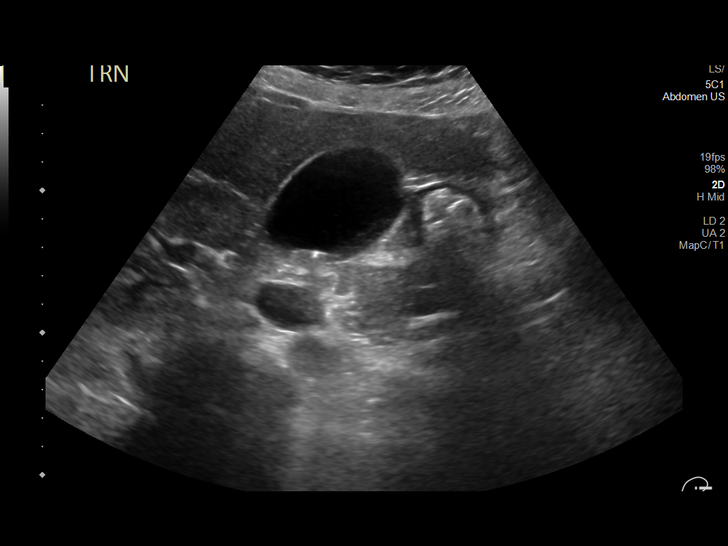
[im 28/51]
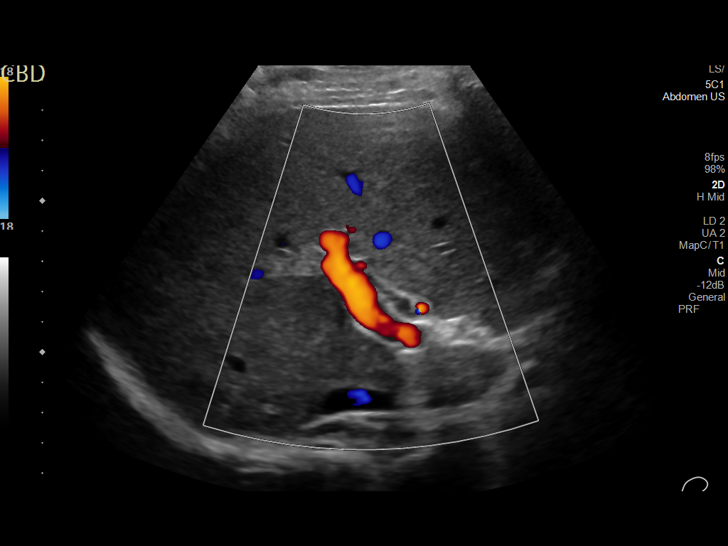
[im 32/51]
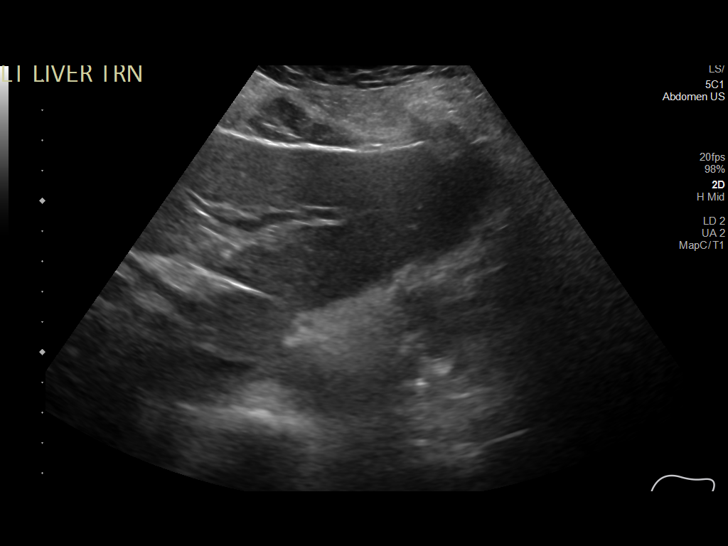
[im 34/51]
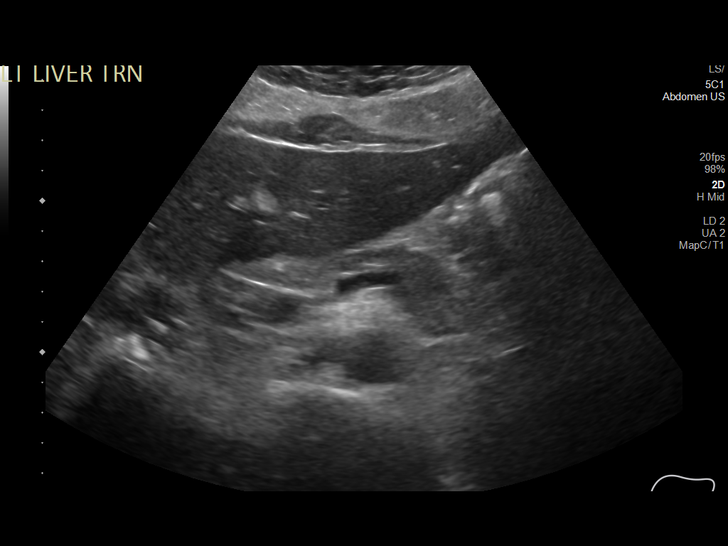
[im 38/51]
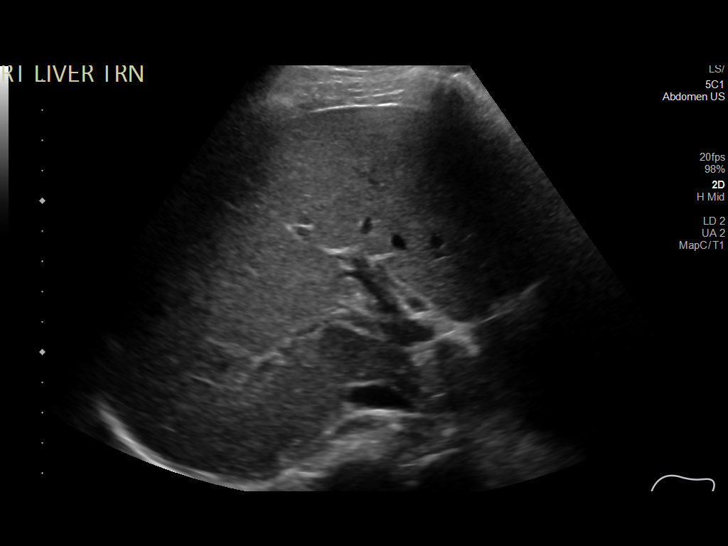
[im 42/51]
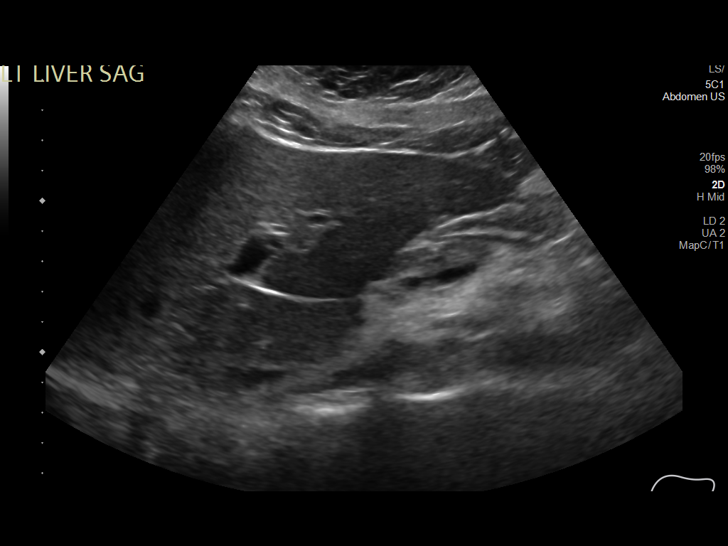
[im 46/51]
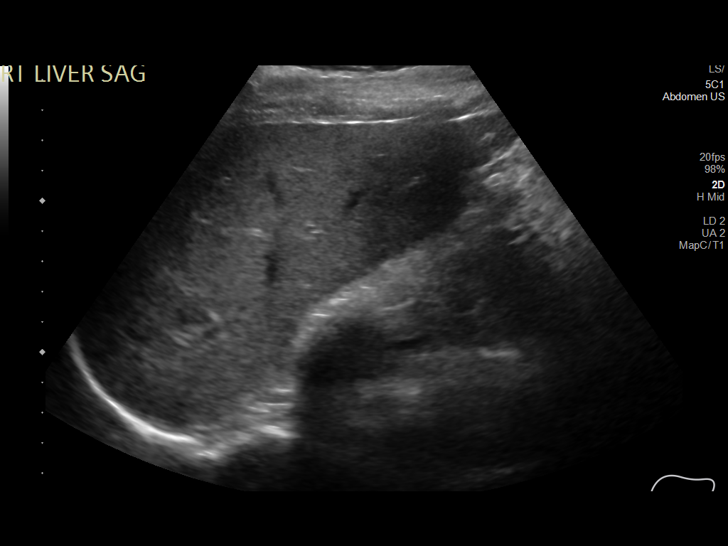
[im 51/51]
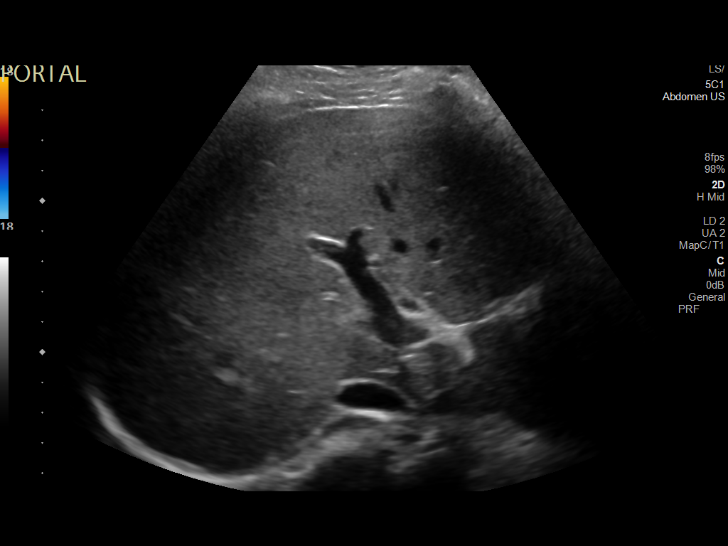

[14 of 25 positions shown; findings below may reference images not displayed]

FINDINGS: Gallbladder:

Multiple small shadowing gallstones, individually estimated up to 10
mm in size (image 18). Gallbladder wall thickness is at the upper
limits of normal, 2-3 mm. But there is no pericholecystic fluid and
no sonographic Murphy sign was elicited.

Common bile duct:

Diameter: 5 mm, normal.

Liver:

No focal lesion identified. Within normal limits in parenchymal
echogenicity. Portal vein is patent on color Doppler imaging with
normal direction of blood flow towards the liver.

Other: Negative visible right kidney.
IMPRESSION: Positive for Cholelithiasis, but no evidence of acute cholecystitis
or bile duct obstruction.

## 2023-09-30 ENCOUNTER — Ambulatory Visit (HOSPITAL_COMMUNITY)
Admission: EM | Admit: 2023-09-30 | Discharge: 2023-09-30 | Disposition: A | Discharge status: Home / Self Care | Payer: Medicaid Other | Attending: Emergency Medicine | Admitting: Emergency Medicine

## 2023-09-30 ENCOUNTER — Other Ambulatory Visit: Payer: Self-pay

## 2023-09-30 ENCOUNTER — Encounter (HOSPITAL_COMMUNITY): Payer: Self-pay | Admitting: Emergency Medicine

## 2023-09-30 DIAGNOSIS — Z20828 Contact with and (suspected) exposure to other viral communicable diseases: Secondary | ICD-10-CM | POA: Diagnosis not present

## 2023-09-30 DIAGNOSIS — J111 Influenza due to unidentified influenza virus with other respiratory manifestations: Secondary | ICD-10-CM | POA: Diagnosis not present

## 2023-09-30 DIAGNOSIS — J309 Allergic rhinitis, unspecified: Secondary | ICD-10-CM | POA: Diagnosis not present

## 2023-09-30 MED ORDER — FLUTICASONE PROPIONATE 50 MCG/ACT NA SUSP: 1.0000 | Freq: Every day | NASAL | 2 refills | Status: AC

## 2023-09-30 MED ORDER — CETIRIZINE HCL 10 MG PO TABS: 10.0000 mg | ORAL_TABLET | Freq: Every day | ORAL | 1 refills | Status: AC

## 2023-09-30 MED ORDER — GUAIFENESIN 400 MG PO TABS: ORAL_TABLET | ORAL | 0 refills | Status: AC

## 2023-09-30 MED ORDER — OSELTAMIVIR PHOSPHATE 75 MG PO CAPS: 75.0000 mg | ORAL_CAPSULE | Freq: Two times a day (BID) | ORAL | 0 refills | Status: AC

## 2023-09-30 NOTE — ED Provider Notes (Signed)
 [facility]    CSN: [id number] Arrival date & time: 09/30/23  1041    HISTORY   Chief Complaint  Patient presents with   URI   HPI Peggy [name] is a pleasant, 38 y.o. female who presents to urgent care today. Patient states that this morning she noticed that she had developed a runny nose and a slight cough as well as a sore throat.  Patient states her child tested positive for influenza A 2 days ago.  Patient denies fever, body aches, chills, nausea, vomiting, diarrhea.  Patient endorses a history of allergies, not currently taking any allergy medications on a regular basis.  The history is provided by the patient.   Past Medical History:  Diagnosis Date   Gestational diabetes    metformin   Medical history non-contributory    Pregnancy induced hypertension    Patient Active Problem List   Diagnosis Date Noted   Encounter for IUD insertion 04/29/2020   Postpartum hypertension 04/29/2020   Cocaine use 09/05/2019   History of gestational hypertension 04/25/2018   History of gestational diabetes 01/14/2018   Vitamin D deficiency 09/08/2017   Past Surgical History:  Procedure Laterality Date   COLPOSCOPY     OB History     Gravida  4   Para  4   Term  4   Preterm      AB      Living  4      SAB      IAB      Ectopic      Multiple  0   Live Births  4          Home Medications    Prior to Admission medications   Medication Sig Start Date End Date Taking? Authorizing Provider  cetirizine (ZYRTEC ALLERGY) 10 MG tablet Take 1 tablet (10 mg total) by mouth at bedtime. 09/30/23 03/28/24 Yes Theadora Rama Scales, PA-C  oseltamivir (TAMIFLU) 75 MG capsule Take 1 capsule (75 mg total) by mouth every 12 (twelve) hours for 5 days. 09/30/23 10/05/23 Yes Theadora Rama Scales, PA-C  acetaminophen (TYLENOL) 325 MG tablet Take 2 tablets (650 mg total) by mouth every 6 (six) hours as needed (for pain scale < 4). 02/29/20   Venora Maples, MD   Blood Pressure Monitor MISC For regular home bp monitoring during pregnancy 09/04/19   Arabella Merles, CNM    Family History Family History  Problem Relation Age of Onset   Hypertension Mother    Asthma Daughter    Asthma Son    Diabetes Maternal Grandmother    Other Son        RSV   Social History Social History   Tobacco Use   Smoking status: Every Day    Current packs/day: 0.25    Types: Cigarettes   Smokeless tobacco: Never  Vaping Use   Vaping status: Some Days  Substance Use Topics   Alcohol use: Yes   Drug use: Yes    Types: Cocaine   Allergies   Aspirin  Review of Systems Review of Systems Pertinent findings revealed after performing a 14 point review of systems has been noted in the history of present illness.  Physical Exam Vital Signs BP 121/82 (BP Location: Left Arm)   Pulse 77   Temp 98.2 F (36.8 C) (Oral)   Resp 20   SpO2 95%   No data found.  Physical Exam Constitutional:      Appearance: She is  ill-appearing.  HENT:     Head: Normocephalic and atraumatic.     Salivary Glands: Right salivary gland is not diffusely enlarged or tender. Left salivary gland is not diffusely enlarged or tender.     Right Ear: Tympanic membrane, ear canal and external ear normal.     Left Ear: Tympanic membrane, ear canal and external ear normal.     Nose: Congestion and rhinorrhea present. Rhinorrhea is clear.     Right Sinus: No maxillary sinus tenderness or frontal sinus tenderness.     Left Sinus: No maxillary sinus tenderness.     Mouth/Throat:     Mouth: Mucous membranes are moist.     Pharynx: Pharyngeal swelling, posterior oropharyngeal erythema and uvula swelling present.     Tonsils: No tonsillar exudate. 0 on the right. 0 on the left.  Cardiovascular:     Rate and Rhythm: Normal rate and regular rhythm.     Pulses: Normal pulses.  Pulmonary:     Effort: Pulmonary effort is normal. No accessory muscle usage, prolonged expiration or respiratory  distress.     Breath sounds: No stridor. No wheezing, rhonchi or rales.     Comments: Turbulent breath sounds throughout without wheeze, rale, rhonchi. Abdominal:     General: Abdomen is flat. Bowel sounds are normal.     Palpations: Abdomen is soft.  Musculoskeletal:        General: Normal range of motion.  Lymphadenopathy:     Cervical: Cervical adenopathy present.     Right cervical: Superficial cervical adenopathy and posterior cervical adenopathy present.     Left cervical: Superficial cervical adenopathy and posterior cervical adenopathy present.  Skin:    General: Skin is warm and dry.  Neurological:     General: No focal deficit present.     Mental Status: She is alert and oriented to person, place, and time.     Motor: Motor function is intact.     Coordination: Coordination is intact.     Gait: Gait is intact.     Deep Tendon Reflexes: Reflexes are normal and symmetric.  Psychiatric:        Attention and Perception: Attention and perception normal.        Mood and Affect: Mood and affect normal.        Speech: Speech normal.        Behavior: Behavior normal. Behavior is cooperative.        Thought Content: Thought content normal.     Visual Acuity Right Eye Distance:   Left Eye Distance:   Bilateral Distance:    Right Eye Near:   Left Eye Near:    Bilateral Near:     UC Couse / Diagnostics / Procedures:     Radiology No results found.  Procedures Procedures (including critical care time) EKG  Pending results:  Labs Reviewed - No data to display  Medications Ordered in UC: Medications - No data to display  UC Diagnoses / Final Clinical Impressions(s)   I have reviewed the triage vital signs and the nursing notes.  Pertinent labs & imaging results that were available during my care of the patient were reviewed by me and considered in my medical decision making (see chart for details).    Final diagnoses:  Exposure to influenza  Allergic rhinitis,  unspecified seasonality, unspecified trigger  Influenza-like illness   Influenza testing deferred due to recent exposure to influenza A as well as patient exhibiting signs and symptoms of influenza A at  this time.  Will treat patient empirically for influenza A with Tamiflu.  Patient provided with prescriptions for Zyrtec and Flonase for management of upper respiratory allergy symptoms.  Recommend guaifenesin for daytime cough to promote expectoration.  OTC nighttime cough preparations recommended as well.  Please see discharge instructions below for details of plan of care as provided to patient. ED Prescriptions     Medication Sig Dispense Auth. Provider   oseltamivir (TAMIFLU) 75 MG capsule Take 1 capsule (75 mg total) by mouth every 12 (twelve) hours for 5 days. 10 capsule Theadora Rama Scales, PA-C   cetirizine (ZYRTEC ALLERGY) 10 MG tablet Take 1 tablet (10 mg total) by mouth at bedtime. 90 tablet Theadora Rama Scales, PA-C   fluticasone (FLONASE) 50 MCG/ACT nasal spray Place 1 spray into both nostrils daily. Begin by using 2 sprays in each nare daily for 3 to 5 days, then decrease to 1 spray in each nare daily. 15.8 mL Theadora Rama Scales, PA-C   guaifenesin (HUMIBID E) 400 MG TABS tablet Take 1 tablet 3 times daily as needed for chest congestion and cough 30 tablet Theadora Rama Scales, PA-C      PDMP not reviewed this encounter.  Pending results:  Labs Reviewed - No data to display    Discharge Instructions      Because you been exposed to influenza in your household, because you are exhibiting signs of influenza, I recommend that you begin taking Tamiflu now for treatment of influenza.  Tamiflu will reduce the amount of virus in your body and stop it from reproducing.  This will help keep you from feeling any sicker and help you feel better sooner.  I have sent a prescription to your pharmacy.    Conservative care is recommended with rest, drinking plenty of clear  fluids, eating only when hungry, taking supportive medications for your symptoms and avoiding being around other people.  Please remain at home until you are fever free for 24 hours without the use of antifever medications such as Tylenol and ibuprofen.   Please read below to learn more about the medications, dosages and frequencies that I recommend to help alleviate your symptoms and to get you feeling better soon:   Zyrtec (cetirizine): This is an excellent second-generation antihistamine that helps to reduce respiratory inflammatory response to environmental allergens.  In some patients, this medication can cause daytime sleepiness so I recommend that you take 1 tablet daily at bedtime.     Advil, Motrin (ibuprofen): This is a good anti-inflammatory medication which addresses aches, pains and inflammation of the upper airways that causes sinus and nasal congestion as well as in the lower airways which makes your cough feel tight and sometimes burn.  I recommend that you take between 400 to 600 mg every 6-8 hours as needed.  Please do not take more than 2400 mg of ibuprofen in a 24-hour period and please do not take high doses of ibuprofen for more than 3 days in a row as this can lead to stomach ulcers.   NyQuil Severe Cold and Flu Liquid, NyQuil VapoCOOL Caplets / Tylenol Cold + Flu + Cough Nighttime Liquid: These are identical over-the-counter multisymptom medications that contain a fever/pain reliever, acetaminophen 650 mg, a cough suppressant, dextromethorphan 30 mg, and an antihistamine commonly found and sleep medications such as Unisom and Sleep-Aid that dries out mucus production and helps you sleep, doxylamine 12.5 mg.  Please do not take this medication along with other cough suppressants with the  letters "DM" or with other antihistamines such as Benadryl, Zyrtec, Xyzal, Allegra or Claritin to avoid overdose.  Please be careful when taking this medication along with other medications containing  acetaminophen or Tylenol.  The maximum dose of Tylenol in a 24-hour period is 3000 mg, taking more than 3000 mg can damage your liver.   DayQuil Severe Cold and Flu Liquid / Mucinex Max Strength Cold and Flu Liquidgels: These are identical over-the-counter multisymptom medications that contain a fever/pain reliever, acetaminophen 650 mg, a cough suppressant, dextromethorphan 20 mg, an expectorant which loosens congestion to make coughing easier, guaifenesin 400 mg, and a decongestant intended to stop mucous production, phenylephrine 10 mg.  Please keep in mind that the FDA recently stated that phenylephrine is ineffective.  My personal recommendation is to avoid combination medications such as this one and to instead choose your own combination of the single symptom relievers listed in this summary.  Please be careful when taking this medication along with other medications containing acetaminophen or Tylenol.  The maximum dose of Tylenol in a 24-hour period is 3000 mg, taking more than 3000 mg can damage your liver.  Robitussin, Mucinex (guaifenesin): This is a daytime expectorant.  This single symptom reliever helps break up chest congestion and loosen up thick nasal drainage making phlegm and drainage easier to cough up and to blow out from your nose.  I recommend taking 400 mg in either liquid or tablet form three times daily as needed.  I do not recommend the 12-hour extended relief version or doses higher than 400 mg per each dose as these often make some patients feel jittery or jumpy and can interfere with sleep.  I also do not recommend that you purchase guaifenesin with the ingredient " DM" which is dextromethorphan, a cough suppressant which I only recommend taking at bedtime.  Guaifenesin 400 mg is a safe dose for people who are being treated for high blood pressure.     If symptoms have not meaningfully improved in the next 5 to 7 days, please return for repeat evaluation or follow-up with your  regular provider.  If symptoms have worsened in the next 3 to 5 days, please go to the emergency room for further evaluation.    Thank you for visiting urgent care today.  We appreciate the opportunity to participate in your care.     Disposition Upon Discharge:  Condition: stable for discharge home  Patient presented with an acute illness with associated systemic symptoms and significant discomfort requiring urgent management. In my opinion, this is a condition that a prudent lay person (someone who possesses an average knowledge of health and medicine) may potentially expect to result in complications if not addressed urgently such as respiratory distress, impairment of bodily function or dysfunction of bodily organs.   Routine symptom specific, illness specific and/or disease specific instructions were discussed with the patient and/or caregiver at length.   As such, the patient has been evaluated and assessed, work-up was performed and treatment was provided in alignment with urgent care protocols and evidence based medicine.  Patient/parent/caregiver has been advised that the patient may require follow up for further testing and treatment if the symptoms continue in spite of treatment, as clinically indicated and appropriate.  Patient/parent/caregiver has been advised to return to the [facility] or PCP if no better; to PCP or the Emergency Department if new signs and symptoms develop, or if the current signs or symptoms continue to change or worsen for further workup, evaluation  and treatment as clinically indicated and appropriate  The patient will follow up with their current PCP if and as advised. If the patient does not currently have a PCP we will assist them in obtaining one.   The patient may need specialty follow up if the symptoms continue, in spite of conservative treatment and management, for further workup, evaluation, consultation and treatment as clinically indicated and  appropriate.  Patient/parent/caregiver verbalized understanding and agreement of plan as discussed.  All questions were addressed during visit.  Please see discharge instructions below for further details of plan.  This office note has been dictated using Teaching laboratory technician.  Unfortunately, this method of dictation can sometimes lead to typographical or grammatical errors.  I apologize for your inconvenience in advance if this occurs.  Please do not hesitate to reach out to me if clarification is needed.      Theadora Rama Scales, PA-C 09/30/23 1512

## 2023-09-30 NOTE — Discharge Instructions (Addendum)
Because you been exposed to influenza in your household, because you are exhibiting signs of influenza, I recommend that you begin taking Tamiflu now for treatment of influenza.  Tamiflu will reduce the amount of virus in your body and stop it from reproducing.  This will help keep you from feeling any sicker and help you feel better sooner.  I have sent a prescription to your pharmacy.    Conservative care is recommended with rest, drinking plenty of clear fluids, eating only when hungry, taking supportive medications for your symptoms and avoiding being around other people.  Please remain at home until you are fever free for 24 hours without the use of antifever medications such as Tylenol and ibuprofen.   Please read below to learn more about the medications, dosages and frequencies that I recommend to help alleviate your symptoms and to get you feeling better soon:   Zyrtec (cetirizine): This is an excellent second-generation antihistamine that helps to reduce respiratory inflammatory response to environmental allergens.  In some patients, this medication can cause daytime sleepiness so I recommend that you take 1 tablet daily at bedtime.     Advil, Motrin (ibuprofen): This is a good anti-inflammatory medication which addresses aches, pains and inflammation of the upper airways that causes sinus and nasal congestion as well as in the lower airways which makes your cough feel tight and sometimes burn.  I recommend that you take between 400 to 600 mg every 6-8 hours as needed.  Please do not take more than 2400 mg of ibuprofen in a 24-hour period and please do not take high doses of ibuprofen for more than 3 days in a row as this can lead to stomach ulcers.   NyQuil Severe Cold and Flu Liquid, NyQuil VapoCOOL Caplets / Tylenol Cold + Flu + Cough Nighttime Liquid: These are identical over-the-counter multisymptom medications that contain a fever/pain reliever, acetaminophen 650 mg, a cough suppressant,  dextromethorphan 30 mg, and an antihistamine commonly found and sleep medications such as Unisom and Sleep-Aid that dries out mucus production and helps you sleep, doxylamine 12.5 mg.  Please do not take this medication along with other cough suppressants with the letters "DM" or with other antihistamines such as Benadryl, Zyrtec, Xyzal, Allegra or Claritin to avoid overdose.  Please be careful when taking this medication along with other medications containing acetaminophen or Tylenol.  The maximum dose of Tylenol in a 24-hour period is 3000 mg, taking more than 3000 mg can damage your liver.   DayQuil Severe Cold and Flu Liquid / Mucinex Max Strength Cold and Flu Liquidgels: These are identical over-the-counter multisymptom medications that contain a fever/pain reliever, acetaminophen 650 mg, a cough suppressant, dextromethorphan 20 mg, an expectorant which loosens congestion to make coughing easier, guaifenesin 400 mg, and a decongestant intended to stop mucous production, phenylephrine 10 mg.  Please keep in mind that the FDA recently stated that phenylephrine is ineffective.  My personal recommendation is to avoid combination medications such as this one and to instead choose your own combination of the single symptom relievers listed in this summary.  Please be careful when taking this medication along with other medications containing acetaminophen or Tylenol.  The maximum dose of Tylenol in a 24-hour period is 3000 mg, taking more than 3000 mg can damage your liver.  Robitussin, Mucinex (guaifenesin): This is a daytime expectorant.  This single symptom reliever helps break up chest congestion and loosen up thick nasal drainage making phlegm and drainage easier to cough  up and to blow out from your nose.  I recommend taking 400 mg in either liquid or tablet form three times daily as needed.  I do not recommend the 12-hour extended relief version or doses higher than 400 mg per each dose as these often  make some patients feel jittery or jumpy and can interfere with sleep.  I also do not recommend that you purchase guaifenesin with the ingredient " DM" which is dextromethorphan, a cough suppressant which I only recommend taking at bedtime.  Guaifenesin 400 mg is a safe dose for people who are being treated for high blood pressure.     If symptoms have not meaningfully improved in the next 5 to 7 days, please return for repeat evaluation or follow-up with your regular provider.  If symptoms have worsened in the next 3 to 5 days, please go to the emergency room for further evaluation.    Thank you for visiting urgent care today.  We appreciate the opportunity to participate in your care.

## 2023-09-30 NOTE — ED Triage Notes (Signed)
This morning noticed runny nose, slight cough.  Patient's child has tested positive for flu 2 days ago.
